# Patient Record
Sex: Female | Born: 1960 | Race: Black or African American | Hispanic: No | Marital: Married | Smoking: Never smoker
Health system: Southern US, Community
[De-identification: ages and names within clinical notes are randomized; demographics above are authoritative.]

## PROBLEM LIST (undated history)

## (undated) DIAGNOSIS — T7840XA Allergy, unspecified, initial encounter: Secondary | ICD-10-CM

## (undated) HISTORY — DX: Allergy, unspecified, initial encounter: T78.40XA

---

## 2009-10-31 ENCOUNTER — Encounter: Admission: RE | Admit: 2009-10-31 | Discharge: 2009-10-31 | Payer: Self-pay | Admitting: Infectious Diseases

## 2009-11-12 ENCOUNTER — Emergency Department (HOSPITAL_COMMUNITY): Admission: EM | Admit: 2009-11-12 | Discharge: 2009-11-13 | Payer: Self-pay | Admitting: Emergency Medicine

## 2009-12-02 ENCOUNTER — Observation Stay (HOSPITAL_COMMUNITY): Admission: EM | Admit: 2009-12-02 | Discharge: 2009-12-02 | Payer: Self-pay | Admitting: Emergency Medicine

## 2009-12-18 ENCOUNTER — Emergency Department (HOSPITAL_COMMUNITY): Admission: EM | Admit: 2009-12-18 | Discharge: 2009-12-18 | Payer: Self-pay | Admitting: Emergency Medicine

## 2009-12-31 ENCOUNTER — Emergency Department (HOSPITAL_COMMUNITY): Admission: EM | Admit: 2009-12-31 | Discharge: 2009-12-31 | Payer: Self-pay | Admitting: Emergency Medicine

## 2010-06-07 LAB — DIFFERENTIAL
Basophils Absolute: 0.1 10*3/uL (ref 0.0–0.1)
Lymphocytes Relative: 31 % (ref 12–46)

## 2010-06-07 LAB — BASIC METABOLIC PANEL
GFR calc Af Amer: >60 mL/min (ref >60)
Glucose, Bld: 111 mg/dL — ABNORMAL HIGH (ref 70–99)
Sodium: 138 mEq/L (ref 135–145)

## 2010-06-07 LAB — CBC
HCT: 41.9 % (ref 36.0–46.0)
MCV: 89.9 fL (ref 78.0–100.0)
Platelets: 339 10*3/uL (ref 150–400)
RDW: 12.2 % (ref 11.5–15.5)

## 2012-01-22 ENCOUNTER — Ambulatory Visit: Payer: Self-pay | Admitting: Family Medicine

## 2012-01-22 VITALS — BP 118/76 | HR 66 | Temp 98.2°F | Resp 16 | Ht 62.75 in | Wt 131.4 lb

## 2012-01-22 DIAGNOSIS — Z23 Encounter for immunization: Secondary | ICD-10-CM

## 2012-01-22 DIAGNOSIS — Z111 Encounter for screening for respiratory tuberculosis: Secondary | ICD-10-CM

## 2012-01-22 NOTE — Progress Notes (Signed)
[facility] [address] [place] [address] [phone]  Date:  01/22/2012   Name:  Teresa Morrison   DOB:  05-10-1960   MRN:  [id number]  PCP:  Sheila Oats, MD    Chief Complaint: Immunizations   History of Present Illness:  Teresa Morrison is a 51 y.o. very pleasant female patient who presents with the following:  Needs a TB screening for attendance at [facility].  She did have BCG when she was a child.  She has had a PPD in the past and been negative.  She has no symptoms of active TB   Tuberculosis Risk Questionnaire  1. Were you born outside the [place] in one of the following parts of the world:    [place], [place], [place], [place] or [place]?  Yes [place]  2. Have you traveled outside the [place] and lived for more than one month in one of the following parts of the world:  [place], [place], [place], [place] or [place]?  She last visited [place] about 2 years ago  3. Do you have a compromised immune system such as from any of the following conditions:  HIV/AIDS, organ or bone marrow transplantation, diabetes, immunosuppressive   medicines (e.g. Prednisone, Remicaide), leukemia, lymphoma, cancer of the   head or neck, gastrectomy or jejunal bypass, end-stage renal disease (on   dialysis), or silicosis?  No    4. Have you ever done one of the following:    Used crack cocaine, injected illegal drugs, worked or resided in jail or prison,   worked or resided at a homeless shelter, or worked as a Research scientist (physical sciences) in   direct contact with patients?  No  5. Have you ever been exposed to anyone with infectious tuberculosis?  No   Tuberculosis Symptom Questionnaire  Do you currently have any of the following symptoms?  1. Unexplained cough lasting more than 3 weeks? No  Unexplained fever lasting more than 3 weeks. No   3. Night Sweats (sweating that leaves the bedclothes and sheets wet)   No  4. Shortness  of Breath No  5. Chest Pain No  6. Unintentional weight loss  No  7. Unexplained fatigue (very tired for no reason) No    There is no problem list on file for this patient.   No past medical history on file.  No past surgical history on file.  History  Substance Use Topics  . Smoking status: Never Smoker   . Smokeless tobacco: Not on file  . Alcohol Use: Not on file    No family history on file.  No Known Allergies  Medication list has been reviewed and updated.  No current outpatient prescriptions on file prior to visit.    Review of Systems:  As per HPI- otherwise negative.   Physical Examination: Filed Vitals:   01/22/12 1018  BP: 118/76  Pulse: 66  Temp: 98.2 F (36.8 C)  Resp: 16   Filed Vitals:   01/22/12 1018  Height: 5' 2.75" (1.594 m)  Weight: 131 lb 6.4 oz (59.603 kg)   Body mass index is 23.46 kg/(m^2). Ideal Body Weight: Weight in (lb) to have BMI = 25: 139.7   GEN: WDWN, NAD, Non-toxic, A & O x 3 HEENT: Atraumatic, Normocephalic. Neck supple. No masses, No LAD. Ears and Nose: No external deformity. CV: RRR, No M/G/R. No JVD. No thrill. No extra heart sounds. PULM: CTA B, no wheezes, crackles, rhonchi. No retractions. No  resp. distress. No accessory muscle use. EXTR: No c/c/e NEURO Normal gait.  PSYCH: Normally interactive. Conversant. Not depressed or anxious appearing.  Calm demeanor.    Assessment and Plan: 1. Need for prophylactic vaccination and inoculation against influenza  Flu vaccine greater than or equal to 3yo preservative free IM  2. Screening-pulmonary TB     Gave letter stating that she does not have active pulmonary TB.  If a PPD is required she should be able to get one at a later date  [facility], MD

## 2012-12-26 ENCOUNTER — Ambulatory Visit: Payer: Self-pay | Admitting: Physician Assistant

## 2012-12-26 VITALS — BP 115/70 | HR 80 | Temp 98.4°F | Resp 18 | Wt 128.0 lb

## 2012-12-26 DIAGNOSIS — Z111 Encounter for screening for respiratory tuberculosis: Secondary | ICD-10-CM

## 2012-12-26 NOTE — Progress Notes (Signed)
  Tuberculosis Risk Questionnaire  1. Yes Were you born outside the [place] in one of the following parts of the world: [place], [place], [place], [place] or Eastern [place]?    2. No Have you traveled outside the [place] and lived for more than one month in one of the following parts of the world: [place], [place], [place], [place] or Eastern [place]?    3. No Do you have a compromised immune system such as from any of the following conditions:HIV/AIDS, organ or bone marrow transplantation, diabetes, immunosuppressive medicines (e.g. Prednisone, Remicaide), leukemia, lymphoma, cancer of the head or neck, gastrectomy or jejunal bypass, end-stage renal disease (on dialysis), or silicosis?     4. 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    5. No Have you ever: injected illegal drugs, used crack cocaine, lived in a homeless shelter  or been in jail or prison?     6. No Have you ever been exposed to anyone with infectious tuberculosis?    Tuberculosis Symptom Questionnaire  Do you currently have any of the following symptoms?  1. No Unexplained cough lasting more than 3 weeks?   2. No Unexplained fever lasting more than 3 weeks.   3. No Night Sweats (sweating that leaves the bedclothes and sheets wet)     4. No Shortness of Breath   5. No Chest Pain   6. No Unintentional weight loss    7. No Unexplained fatigue (very tired for no reason)   

## 2012-12-26 NOTE — Progress Notes (Signed)
  Subjective:    Patient ID: Teresa Morrison, female    DOB: 1960-06-15, 52 y.o.   MRN: [id number]  HPI 52 year old female presents for TB screening. 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  She was born in [place] and had the BCG shot at that time. Has had negative TB tests since then as well as a CXR.    See TB screening questionnaire.     Review of Systems  Constitutional: Negative for fever and chills.  Respiratory: Negative for cough.   Cardiovascular: Negative for chest pain.  Neurological: Negative for dizziness.       Objective:   Physical Exam  Constitutional: She is oriented to person, place, and time. She appears well-developed and well-nourished.  HENT:  Head: Normocephalic and atraumatic.  Right Ear: External ear normal.  Left Ear: External ear normal.  Eyes: Conjunctivae are normal.  Neck: Normal range of motion.  Cardiovascular: Normal rate.   Pulmonary/Chest: Effort normal.  Neurological: She is alert and oriented to person, place, and time.  Psychiatric: She has a normal mood and affect. Her behavior is normal. Judgment and thought content normal.          Assessment & Plan:  Screening for tuberculosis - Plan: TB Skin Test  TB test placed. Return in 48-72 hours for read.

## 2012-12-28 ENCOUNTER — Ambulatory Visit (INDEPENDENT_AMBULATORY_CARE_PROVIDER_SITE_OTHER): Payer: Self-pay | Admitting: Family Medicine

## 2012-12-28 DIAGNOSIS — Z111 Encounter for screening for respiratory tuberculosis: Secondary | ICD-10-CM

## 2012-12-28 LAB — TB SKIN TEST: TB Skin Test: POSITIVE

## 2014-02-21 ENCOUNTER — Ambulatory Visit (INDEPENDENT_AMBULATORY_CARE_PROVIDER_SITE_OTHER): Payer: PRIVATE HEALTH INSURANCE | Admitting: Physician Assistant

## 2014-02-21 VITALS — BP 116/70 | HR 77 | Temp 97.9°F | Resp 18 | Ht 63.0 in | Wt 122.0 lb

## 2014-02-21 DIAGNOSIS — Z131 Encounter for screening for diabetes mellitus: Secondary | ICD-10-CM

## 2014-02-21 DIAGNOSIS — Z13220 Encounter for screening for lipoid disorders: Secondary | ICD-10-CM

## 2014-02-21 LAB — LIPID PANEL
Cholesterol: 178 mg/dL (ref 0–200)
HDL: 67 mg/dL (ref >39)
LDL CALC: 100 mg/dL — AB (ref 0–99)
Total CHOL/HDL Ratio: 2.7 Ratio
Triglycerides: 57 mg/dL (ref <150)
VLDL: 11 mg/dL (ref 0–40)

## 2014-02-21 LAB — GLUCOSE, POCT (MANUAL RESULT ENTRY): POC GLUCOSE: 89 mg/dL (ref 70–99)

## 2014-02-21 NOTE — Progress Notes (Signed)
    IDENTIFYING INFORMATION  Teresa Morrison / DOB: Aug 18, 1960 / MRN: [id number]  The patient has no medical problems.  SUBJECTIVE  Chief Complaint: Labs Only   History of present illness: [name] is a 53 y.o. year old female who presents for a cholesterol and blood sugar check.  She has no complaints today. [place] has been in [place] and moved from [place].  She declines an annual physical today due to cost.      She  has a past medical history of Allergy.  However, since [place] she has not had difficulty with this.    She takes no medication.   [name] has No Known Allergies.  She  reports that she currently engages in sexual activity.  The patient has had no surgery.  Her family history is negative for chronic disease.   Review of Systems  Constitutional: Negative for fever, chills and weight loss.  Eyes: Negative for blurred vision.  Respiratory: Negative for cough and wheezing.   Cardiovascular: Negative for chest pain and palpitations.  Gastrointestinal: Negative for heartburn, nausea and vomiting.  Genitourinary: Negative for dysuria, urgency and frequency.  Musculoskeletal: Negative for myalgias.  Skin: Negative for itching and rash.  Neurological: Negative for dizziness and headaches.  Psychiatric/Behavioral: Negative for depression.    OBJECTIVE  Blood pressure 116/70, pulse 77, temperature 97.9 F (36.6 C), temperature source Oral, resp. rate 18, height 5\' 3"  (1.6 m), weight 122 lb (55.339 kg), SpO2 98 %. The patient's body mass index is 21.62 kg/(m^2).  Physical Exam  Constitutional: She is oriented to person, place, and time. She appears well-developed and well-nourished. No distress.  HENT:  Head: Normocephalic.  Eyes: EOM are normal.  Neck: Normal range of motion.  Cardiovascular: Normal rate and regular rhythm.   Respiratory: Effort normal and breath sounds normal.  GI: Soft. Bowel sounds are normal.  Musculoskeletal:  Normal range of motion.  Neurological: She is alert and oriented to person, place, and time. She has normal reflexes.  Skin: Skin is warm and dry. She is not diaphoretic.  Psychiatric: She has a normal mood and affect. Her behavior is normal. Judgment and thought content normal.    Results for orders placed or performed in visit on 02/21/14  Lipid panel  Result Value Ref Range   Cholesterol 178 0 - 200 mg/dL   Triglycerides 57 <308<150 mg/dL   HDL 67 >65>39 mg/dL   Total CHOL/HDL Ratio 2.7 Ratio   VLDL 11 0 - 40 mg/dL   LDL Cholesterol 784100 (H) 0 - 99 mg/dL  POCT glucose (manual entry)  Result Value Ref Range   POC Glucose 89 70 - 99 mg/dl     ASSESSMENT & PLAN  [name] was seen today for labs only.  Diagnoses and associated orders for this visit:  Screening for diabetes mellitus (DM) - POCT glucose (manual entry)  Screening for lipid disorders - Lipid panel     The patient was instructed to to call or comeback to clinic as needed, or should symptoms warrant.  [name], MHS, PA-C [facility] 02/22/2014 11:06 AM

## 2014-02-21 NOTE — Patient Instructions (Addendum)
Exercise improves every system in the body.  It lowers the risk of heart disease, decreases blood pressure, reduces the symptoms of depression and anxiety, and lowers blood sugar. To receive these benefits, try to get 150 minutes of planned exercise each week.  You can break this 150 minutes up however you like.  For instance, you can perform 30 minutes of brisk walking 5 days a week, or perform 50 minutes 3 days a week.  If you don't like walking, or can't find a safe place to walk, find another way to move that you can enjoy.  Exercise tapes, cycling, stair climbing, swimming, or a combination will be just as good as a walking program. To ensure the proper intensity, you can use the talk test. Essentially, you should be able to carry on a conversation, but you should have to take short breaks from the conversation in order catch your breath.  Try to eat fish twice a week, or take a fish oil product over the counter daily. Speak to the pharmacist about which would be appropriate for you.    Keeping You Healthy  Get These Tests  Blood Pressure- Have your blood pressure checked by your healthcare provider at least once a year.  Normal blood pressure is 120/80.  Weight- Have your body mass index (BMI) calculated to screen for obesity.  BMI is a measure of body fat based on height and weight.  You can calculate your own BMI at https://www.west-esparza.com/www.nhlbisupport.com/bmi/  Cholesterol- Have your cholesterol checked every year.  Diabetes- Have your blood sugar checked every year if you have high blood pressure, high cholesterol, a family history of diabetes or if you are overweight.  Pap Smear- Have a pap smear every 1 to 3 years if you have been sexually active.  If you are older than 65 and recent pap smears have been normal you may not need additional pap smears.  In addition, if you have had a hysterectomy  For benign disease additional pap smears are not necessary.  Mammogram-Yearly mammograms are essential for early  detection of breast cancer  Screening for Colon Cancer- Colonoscopy starting at age 54. Screening may begin sooner depending on your family history and other health conditions.  Follow up colonoscopy as directed by your Gastroenterologist.  Screening for Osteoporosis- Screening begins at age 54 with bone density scanning, sooner if you are at higher risk for developing Osteoporosis.  Get these medicines  Calcium with Vitamin D- Your body requires 1200-1500 mg of Calcium a day and [id number] IU of Vitamin D a day.  You can only absorb 500 mg of Calcium at a time therefore Calcium must be taken in 2 or 3 separate doses throughout the day.  Hormones- Hormone therapy has been associated with increased risk for certain cancers and heart disease.  Talk to your healthcare provider about if you need relief from menopausal symptoms.  Aspirin- Ask your healthcare provider about taking Aspirin to prevent Heart Disease and Stroke.  Get these Immuniztions  Flu shot- Every fall  Pneumonia shot- Once after the age of 54; if you are younger ask your healthcare provider if you need a pneumonia shot.  Tetanus- Every ten years.  Zostavax- Once after the age of 54 to prevent shingles.  Take these steps  Don't smoke- Your healthcare provider can help you quit. For tips on how to quit, ask your healthcare provider or go to www.smokefree.gov or call 1-800 QUIT-NOW.  Be physically active- Exercise 5 days a week for  a minimum of 30 minutes.  If you are not already physically active, start slow and gradually work up to 30 minutes of moderate physical activity.  Try walking, dancing, bike riding, swimming, etc.  Eat a healthy diet- Eat a variety of healthy foods such as fruits, vegetables, whole grains, low fat milk, low fat cheeses, yogurt, lean meats, chicken, fish, eggs, dried beans, tofu, etc.  For more information go to www.thenutritionsource.org  Dental visit- Brush and floss teeth twice daily; visit your  dentist twice a year.  Eye exam- Visit your Optometrist or Ophthalmologist yearly.  Drink alcohol in moderation- Limit alcohol intake to one drink or less a day.  Never drink and drive.  Depression- Your emotional health is as important as your physical health.  If you're feeling down or losing interest in things you normally enjoy, please talk to your healthcare provider.  Seat Belts- can save your life; always wear one  Smoke/Carbon Monoxide detectors- These detectors need to be installed on the appropriate level of your home.  Replace batteries at least once a year.  Violence- If anyone is threatening or hurting you, please tell your healthcare provider.  Living Will/ Health care power of attorney- Discuss with your healthcare provider and family.

## 2014-02-28 NOTE — Progress Notes (Signed)
I have discussed this case with Mr. Clark, PA-C and agree.  

## 2015-05-15 ENCOUNTER — Encounter: Payer: Self-pay | Admitting: Physician Assistant

## 2015-05-15 ENCOUNTER — Ambulatory Visit (INDEPENDENT_AMBULATORY_CARE_PROVIDER_SITE_OTHER): Payer: Managed Care, Other (non HMO) | Admitting: Physician Assistant

## 2015-05-15 VITALS — BP 119/75 | HR 51 | Temp 98.4°F | Resp 16 | Ht 62.5 in | Wt 131.0 lb

## 2015-05-15 DIAGNOSIS — R51 Headache: Secondary | ICD-10-CM | POA: Diagnosis not present

## 2015-05-15 DIAGNOSIS — R69 Illness, unspecified: Principal | ICD-10-CM

## 2015-05-15 DIAGNOSIS — M791 Myalgia: Secondary | ICD-10-CM

## 2015-05-15 DIAGNOSIS — R509 Fever, unspecified: Secondary | ICD-10-CM | POA: Diagnosis not present

## 2015-05-15 DIAGNOSIS — R05 Cough: Secondary | ICD-10-CM | POA: Diagnosis not present

## 2015-05-15 DIAGNOSIS — J111 Influenza due to unidentified influenza virus with other respiratory manifestations: Secondary | ICD-10-CM

## 2015-05-15 DIAGNOSIS — J101 Influenza due to other identified influenza virus with other respiratory manifestations: Secondary | ICD-10-CM

## 2015-05-15 LAB — POCT INFLUENZA A/B
INFLUENZA A, POC: POSITIVE — AB
INFLUENZA B, POC: NEGATIVE

## 2015-05-15 MED ORDER — OSELTAMIVIR PHOSPHATE 75 MG PO CAPS: 75.0000 mg | ORAL_CAPSULE | Freq: Two times a day (BID) | ORAL | Status: DC

## 2015-05-15 NOTE — Patient Instructions (Signed)
Take 600 mg Ibuprofen every 6-8 hours for fever, pain and fatigue.

## 2015-05-15 NOTE — Progress Notes (Signed)
   05/15/2015 4:12 PM   DOB: April 28, 1960 / MRN: [id number]  SUBJECTIVE:  Teresa Morrison is a 55 y.o. female presenting for fever that started on Friday night.  Assoicates myagia, fatigue, poor appetitie, HA. She denies cough, dysuria.  She has tried Advil with some relief.      She has No Known Allergies.   She  has a past medical history of Allergy.    She  reports that she has never smoked. She does not have any smokeless tobacco history on file. She reports that she does not drink alcohol or use illicit drugs. She  reports that she currently engages in sexual activity. The patient  has no past surgical history on file.  Her family history is not on file.  Review of Systems  Constitutional: Positive for fever and chills.  HENT: Negative for sore throat.   Eyes: Negative for blurred vision.  Respiratory: Positive for cough and sputum production. Negative for shortness of breath and wheezing.   Cardiovascular: Negative for chest pain.  Gastrointestinal: Negative for nausea and abdominal pain.  Genitourinary: Negative for dysuria, urgency and frequency.  Musculoskeletal: Positive for myalgias.  Skin: Negative for rash.  Neurological: Positive for headaches. Negative for dizziness and tingling.  Psychiatric/Behavioral: Negative for depression. The patient is not nervous/anxious.     Problem list and medications reviewed and updated by myself where necessary, and exist elsewhere in the encounter.   OBJECTIVE:  BP 119/75 mmHg  Pulse 51  Temp(Src) 98.4 F (36.9 C)  Resp 16  Ht 5' 2.5" (1.588 m)  Wt 131 lb (59.421 kg)  BMI 23.56 kg/m2  Physical Exam  Constitutional: She is oriented to person, place, and time. She appears well-developed.  Eyes: EOM are normal. Pupils are equal, round, and reactive to light.  Cardiovascular: Normal rate.   Pulmonary/Chest: Effort normal.  Abdominal: She exhibits no distension.  Musculoskeletal: Normal range of motion.  Neurological: She is  alert and oriented to person, place, and time. No cranial nerve deficit.  Skin: Skin is warm and dry. She is not diaphoretic.  Psychiatric: She has a normal mood and affect.  Vitals reviewed.   Results for orders placed or performed in visit on 05/15/15 (from the past 72 hour(s))  POCT Influenza A/B     Status: Abnormal   Collection Time: 05/15/15  3:56 PM  Result Value Ref Range   Influenza A, POC Positive (A) Negative   Influenza B, POC Negative Negative    No results found.  ASSESSMENT AND PLAN  Teresa Morrison was seen today for fever, joint pain and headache.  Diagnoses and all orders for this visit:  Influenza-like illness -     POCT Influenza A/B  Influenza A -     oseltamivir (TAMIFLU) 75 MG capsule; Take 1 capsule (75 mg total) by mouth 2 (two) times daily.    The patient was advised to call or return to clinic if she does not see an improvement in symptoms or to seek the care of the closest emergency department if she worsens with the above plan.   [name], MHS, PA-C [facility] 05/15/2015 4:12 PM

## 2016-01-06 ENCOUNTER — Ambulatory Visit (INDEPENDENT_AMBULATORY_CARE_PROVIDER_SITE_OTHER): Payer: Managed Care, Other (non HMO) | Admitting: Family Medicine

## 2016-01-06 ENCOUNTER — Encounter: Payer: Self-pay | Admitting: Family Medicine

## 2016-01-06 VITALS — BP 102/66 | HR 72 | Temp 98.3°F | Resp 18 | Ht 62.5 in | Wt 134.0 lb

## 2016-01-06 DIAGNOSIS — R1011 Right upper quadrant pain: Secondary | ICD-10-CM | POA: Diagnosis not present

## 2016-01-06 DIAGNOSIS — Z131 Encounter for screening for diabetes mellitus: Secondary | ICD-10-CM | POA: Diagnosis not present

## 2016-01-06 DIAGNOSIS — Z13220 Encounter for screening for lipoid disorders: Secondary | ICD-10-CM | POA: Diagnosis not present

## 2016-01-06 LAB — LIPID PANEL
Cholesterol: 175 mg/dL (ref 125–200)
HDL: 65 mg/dL (ref >=46)
LDL CALC: 93 mg/dL (ref <130)
TRIGLYCERIDES: 84 mg/dL (ref <150)
Total CHOL/HDL Ratio: 2.7 Ratio (ref <=5.0)
VLDL: 17 mg/dL (ref <30)

## 2016-01-06 LAB — CBC WITH DIFFERENTIAL/PLATELET
BASOS PCT: 0 %
Basophils Absolute: 0 cells/uL (ref 0–200)
EOS PCT: 4 %
Eosinophils Absolute: 272 cells/uL (ref 15–500)
HEMATOCRIT: 36.1 % (ref 35.0–45.0)
HEMOGLOBIN: 12.1 g/dL (ref 11.7–15.5)
LYMPHS ABS: 2788 {cells}/uL (ref 850–3900)
Lymphocytes Relative: 41 %
MCH: 30.3 pg (ref 27.0–33.0)
MCHC: 33.5 g/dL (ref 32.0–36.0)
MCV: 90.5 fL (ref 80.0–100.0)
MONO ABS: 408 {cells}/uL (ref 200–950)
MPV: 11.1 fL (ref 7.5–12.5)
Monocytes Relative: 6 %
NEUTROS ABS: 3332 {cells}/uL (ref 1500–7800)
Neutrophils Relative %: 49 %
Platelets: 272 10*3/uL (ref 140–400)
RBC: 3.99 MIL/uL (ref 3.80–5.10)
RDW: 12.6 % (ref 11.0–15.0)
WBC: 6.8 10*3/uL (ref 3.8–10.8)

## 2016-01-06 LAB — COMPREHENSIVE METABOLIC PANEL
ALBUMIN: 3.9 g/dL (ref 3.6–5.1)
ALK PHOS: 59 U/L (ref 33–130)
ALT: 16 U/L (ref 6–29)
AST: 24 U/L (ref 10–35)
BILIRUBIN TOTAL: 0.2 mg/dL (ref 0.2–1.2)
BUN: 15 mg/dL (ref 7–25)
CALCIUM: 9.2 mg/dL (ref 8.6–10.4)
CO2: 27 mmol/L (ref 20–31)
CREATININE: 0.82 mg/dL (ref 0.50–1.05)
Chloride: 106 mmol/L (ref 98–110)
Glucose, Bld: 82 mg/dL (ref 65–99)
Potassium: 4.7 mmol/L (ref 3.5–5.3)
SODIUM: 140 mmol/L (ref 135–146)
Total Protein: 7.2 g/dL (ref 6.1–8.1)

## 2016-01-06 LAB — POCT URINALYSIS DIP (MANUAL ENTRY)
Bilirubin, UA: NEGATIVE
Blood, UA: NEGATIVE
GLUCOSE UA: NEGATIVE
Ketones, POC UA: NEGATIVE
NITRITE UA: NEGATIVE
PROTEIN UA: NEGATIVE
SPEC GRAV UA: 1.02
UROBILINOGEN UA: 0.2
pH, UA: 5

## 2016-01-06 LAB — HEMOGLOBIN A1C
HEMOGLOBIN A1C: 5.3 % (ref <5.7)
Mean Plasma Glucose: 105 mg/dL

## 2016-01-06 NOTE — Patient Instructions (Addendum)
IF you received an x-ray today, you will receive an invoice from [facility]. Please contact [facility] at [phone] with questions or concerns regarding your invoice.   IF you received labwork today, you will receive an invoice from [facility]. Please contact [facility] at [phone] with questions or concerns regarding your invoice.   Our billing staff will not be able to assist you with questions regarding bills from these companies.  You will be contacted with the lab results as soon as they are available. The fastest way to get your results is to activate your My Chart account. Instructions are located on the last page of this paperwork. If you have not heard from us regarding the results in 2 weeks, please contact this office.  Abdominal Pain, Adult Many things can cause abdominal pain. Usually, abdominal pain is not caused by a disease and will improve without treatment. It can often be observed and treated at home. Your health care provider will do a physical exam and possibly order blood tests and X-rays to help determine the seriousness of your pain. However, in many cases, more time must pass before a clear cause of the pain can be found. Before that point, your health care provider may not know if you need more testing or further treatment. HOME CARE INSTRUCTIONS Monitor your abdominal pain for any changes. The following actions may help to alleviate any discomfort you are experiencing:  Only take over-the-counter or prescription medicines as directed by your health care provider.  Do not take laxatives unless directed to do so by your health care provider.  Try a clear liquid diet (broth, tea, or water) as directed by your health care provider. Slowly move to a bland diet as tolerated. SEEK MEDICAL CARE IF:  You have unexplained abdominal pain.  You have abdominal pain associated with nausea or diarrhea.  You have pain when you  urinate or have a bowel movement.  You experience abdominal pain that wakes you in the night.  You have abdominal pain that is worsened or improved by eating food.  You have abdominal pain that is worsened with eating fatty foods.  You have a fever. SEEK IMMEDIATE MEDICAL CARE IF:  Your pain does not go away within 2 hours.  You keep throwing up (vomiting).  Your pain is felt only in portions of the abdomen, such as the right side or the left lower portion of the abdomen.  You pass bloody or black tarry stools. MAKE SURE YOU:  Understand these instructions.  Will watch your condition.  Will get help right away if you are not doing well or get worse.   This information is not intended to replace advice given to you by your health care provider. Make sure you discuss any questions you have with your health care provider.   Document Released: 12/19/2004 Document Revised: 11/30/2014 Document Reviewed: 11/18/2012 Elsevier Interactive Patient Education 2016 [employer]  

## 2016-01-06 NOTE — Progress Notes (Signed)
Subjective:    Patient ID: Teresa Morrison, female    DOB: Nov 27, 1960, 55 y.o.   MRN: [id number]  01/06/2016  Flank Pain (right) and blood work A1C   HPI This 55 y.o. female presents for flank pain and also requesting labs/HgbA1c/lipids.  Onset of RUQ pain six months ago intermittently; pain usually develops after eating a high fatty meal.  Worried about gallbladder pathology. Denies fever/chills/sweats. Denies unintentional weight loss.  No previous colonoscopy; no previous health maintenance such as pap smear or mammography. Denies chest pain or SOB.  No nausea/vomiting/diarrhea/constipation. No bloody stools or melena.  Denies dysuria, hematuria, urgency, or frequency.  LMP ten years ago.  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  Last lipid panel ten years ago in [place].    Review of Systems  Constitutional: Negative for chills, diaphoresis, fatigue and fever.  Eyes: Negative for visual disturbance.  Respiratory: Negative for cough and shortness of breath.   Cardiovascular: Negative for chest pain, palpitations and leg swelling.  Gastrointestinal: Positive for abdominal pain. Negative for abdominal distention, anal bleeding, blood in stool, constipation, diarrhea, nausea, rectal pain and vomiting.  Endocrine: Negative for cold intolerance, heat intolerance, polydipsia, polyphagia and polyuria.  Neurological: Negative for dizziness, tremors, seizures, syncope, facial asymmetry, speech difficulty, weakness, light-headedness, numbness and headaches.    Past Medical History:  Diagnosis Date  . Allergy    History reviewed. No pertinent surgical history. No Known Allergies No current outpatient prescriptions on file.   No current facility-administered medications for this visit.    Social History   Social History  . Marital status: Married    Spouse name: N/A  . Number of children: 5  . Years of education: N/A   Occupational History  . CNA    Social  History Main Topics  . Smoking status: Never Smoker  . Smokeless tobacco: Never Used  . Alcohol use No  . Drug use: No  . Sexual activity: Yes   Other Topics Concern  . Not on file   Social History Narrative  . No narrative on file   History reviewed. No pertinent family history.     Objective:    BP 102/66 (BP Location: Right Arm, Patient Position: Sitting, Cuff Size: Small)   Pulse 72   Temp 98.3 F (36.8 C) (Oral)   Resp 18   Ht 5' 2.5" (1.588 m)   Wt 134 lb (60.8 kg)   SpO2 100%   BMI 24.12 kg/m  Physical Exam  Constitutional: She is oriented to person, place, and time. She appears well-developed and well-nourished. No distress.  HENT:  Head: Normocephalic and atraumatic.  Right Ear: External ear normal.  Left Ear: External ear normal.  Nose: Nose normal.  Mouth/Throat: Oropharynx is clear and moist.  Eyes: Conjunctivae and EOM are normal. Pupils are equal, round, and reactive to light.  Neck: Normal range of motion. Neck supple. Carotid bruit is not present. No thyromegaly present.  Cardiovascular: Normal rate, regular rhythm, normal heart sounds and intact distal pulses.  Exam reveals no gallop and no friction rub.   No murmur heard. Pulmonary/Chest: Effort normal and breath sounds normal. She has no wheezes. She has no rales.  Abdominal: Soft. Bowel sounds are normal. She exhibits no distension and no mass. There is no tenderness. There is no rebound and no guarding.  Musculoskeletal:       Right shoulder: Normal.       Left shoulder: Normal.  Thoracic back: Normal.       Lumbar back: Normal.  +mild TTP R anterior distal rib.  Lymphadenopathy:    She has no cervical adenopathy.  Neurological: She is alert and oriented to person, place, and time. No cranial nerve deficit.  Skin: Skin is warm and dry. No rash noted. She is not diaphoretic. No erythema. No pallor.  Psychiatric: She has a normal mood and affect. Her behavior is normal.    Results for  orders placed or performed in visit on 01/06/16  POCT urinalysis dipstick  Result Value Ref Range   Color, UA yellow yellow   Clarity, UA clear clear   Glucose, UA negative negative   Bilirubin, UA negative negative   Ketones, POC UA negative negative   Spec Grav, UA 1.020    Blood, UA negative negative   pH, UA 5.0    Protein Ur, POC negative negative   Urobilinogen, UA 0.2    Nitrite, UA Negative Negative   Leukocytes, UA small (1+) (A) Negative       Assessment & Plan:   1. Abdominal pain, RUQ   2. Screening, lipid   3. Screening for diabetes mellitus    -New onset in past six months postprandially. -obtain labs and refer for abdominal us. -obtain age appropriate screening labs including HgbA1c and FLP. -RTC in six months for CPE.   Orders Placed This Encounter  Procedures  . US Abdomen Complete    Standing Status:   Future    Standing Expiration Date:   03/07/2017    Scheduling Instructions:     Prefers a.m. Appointment or Saturday or Monday    Order Specific Question:   Reason for Exam (SYMPTOM  OR DIAGNOSIS REQUIRED)    Answer:   postprandial RUQ pain    Order Specific Question:   Preferred imaging location?    Answer:   [facility]  . CBC with Differential/Platelet  . Comprehensive metabolic panel    Order Specific Question:   Has the patient fasted?    Answer:   Yes  . Lipid panel    Order Specific Question:   Has the patient fasted?    Answer:   Yes  . Hemoglobin A1c  . POCT urinalysis dipstick   No orders of the defined types were placed in this encounter.   No Follow-up on file.   Antwanette Wesche Paulita FujitaMartin Yordan Martindale, M.D. [facility]  [place] [address] [place], [place]  [id number] [phone] phone [phone] fax

## 2016-01-22 ENCOUNTER — Telehealth: Payer: Self-pay | Admitting: *Deleted

## 2016-01-22 NOTE — Telephone Encounter (Signed)
Patient calling again about lab results from 01/06/16.

## 2016-01-22 NOTE — Telephone Encounter (Signed)
Patient left message on lab voicemail.  She would like to know her lab results from 01/06/16.  [phone]

## 2016-01-23 ENCOUNTER — Telehealth: Payer: Self-pay | Admitting: Emergency Medicine

## 2016-01-23 NOTE — Telephone Encounter (Signed)
Pt calling back About lab results

## 2016-01-23 NOTE — Telephone Encounter (Signed)
Left message with normal lab results.

## 2016-01-23 NOTE — Telephone Encounter (Signed)
Labs all normal --- 1. No evidence of diabetes; sugar is normal. 2 . Cholesterol is normal. 3 .  Gallbladder labs are normal.  4. Liver and kidney functions are normal.

## 2016-01-24 NOTE — Telephone Encounter (Signed)
Left message to return call 

## 2016-03-11 ENCOUNTER — Telehealth: Payer: Self-pay

## 2016-03-11 NOTE — Telephone Encounter (Signed)
Normal results left on voicemail along with current date/time.

## 2016-03-11 NOTE — Telephone Encounter (Signed)
PATIENT CALLED VERY UPSET BECAUSE SHE SAID WE NEVER CALLED HER WITH HER LAB RESULTS FROM WHEN SHE SAW DR. Katrinka BlazingSMITH IN October. (I TOLD HER WE TRIED TO CALL HER ON January 23, 2016, BUT SHE WOULD NOT LISTEN). PLEASE CALL HER AS SOON AS POSSIBLE. BEST PHONE [phone] (CELL)  [facility]

## 2016-03-13 ENCOUNTER — Telehealth: Payer: Self-pay | Admitting: Emergency Medicine

## 2016-03-13 NOTE — Telephone Encounter (Signed)
Pt called in requesting lab results from one 2 months ago.  Explained to patient we left several messages with negative results with documentation.  Pt is very upset that she received a bill for delayed blood work results.  Attempted to resolve situation but she is very upset.

## 2016-04-12 ENCOUNTER — Encounter: Payer: Self-pay | Admitting: Family Medicine

## 2017-06-21 ENCOUNTER — Ambulatory Visit: Payer: BLUE CROSS/BLUE SHIELD | Admitting: Family Medicine

## 2017-06-21 ENCOUNTER — Other Ambulatory Visit: Payer: Self-pay

## 2017-06-21 ENCOUNTER — Encounter: Payer: Self-pay | Admitting: Family Medicine

## 2017-06-21 ENCOUNTER — Ambulatory Visit (INDEPENDENT_AMBULATORY_CARE_PROVIDER_SITE_OTHER): Payer: BLUE CROSS/BLUE SHIELD

## 2017-06-21 VITALS — BP 102/68 | HR 73 | Temp 98.0°F | Resp 16 | Ht 62.21 in | Wt 135.8 lb

## 2017-06-21 DIAGNOSIS — Z131 Encounter for screening for diabetes mellitus: Secondary | ICD-10-CM | POA: Diagnosis not present

## 2017-06-21 DIAGNOSIS — M25476 Effusion, unspecified foot: Secondary | ICD-10-CM | POA: Diagnosis not present

## 2017-06-21 DIAGNOSIS — M25473 Effusion, unspecified ankle: Secondary | ICD-10-CM | POA: Diagnosis not present

## 2017-06-21 DIAGNOSIS — M25571 Pain in right ankle and joints of right foot: Secondary | ICD-10-CM

## 2017-06-21 DIAGNOSIS — M7732 Calcaneal spur, left foot: Secondary | ICD-10-CM

## 2017-06-21 DIAGNOSIS — Z13220 Encounter for screening for lipoid disorders: Secondary | ICD-10-CM

## 2017-06-21 DIAGNOSIS — M25572 Pain in left ankle and joints of left foot: Secondary | ICD-10-CM | POA: Diagnosis not present

## 2017-06-21 DIAGNOSIS — M25475 Effusion, left foot: Secondary | ICD-10-CM

## 2017-06-21 DIAGNOSIS — M25471 Effusion, right ankle: Secondary | ICD-10-CM

## 2017-06-21 NOTE — Patient Instructions (Addendum)
CLINICAL DATA:  Bilateral ankle pain.  EXAM: LEFT ANKLE COMPLETE - 3+ VIEW  COMPARISON:  None  FINDINGS: There is no evidence of fracture, dislocation, or joint effusion. There is no evidence of arthropathy or other focal bone abnormality. Small plantar heel spur noted. Soft tissues are unremarkable.  IMPRESSION: 1. No acute findings. 2. Heel spur.   Electronically Signed   By: Signa Kell M.D.   On: 06/21/2017 10:15    IF you received an x-ray today, you will receive an invoice from [facility]. Please contact [facility] at [phone] with questions or concerns regarding your invoice.   IF you received labwork today, you will receive an invoice from [facility]. Please contact [facility] at [phone] with questions or concerns regarding your invoice.   Our billing staff will not be able to assist you with questions regarding bills from these companies.  You will be contacted with the lab results as soon as they are available. The fastest way to get your results is to activate your My Chart account. Instructions are located on the last page of this paperwork. If you have not heard from [place] regarding the results in 2 weeks, please contact this office.      Heel Spur A heel spur is a bony growth that forms on the bottom of your heel bone (calcaneus). Heel spurs are common and do not always cause pain. However, heel spurs often cause inflammation in the strong band of tissue that runs underneath the bone of your foot (plantar fascia). When this happens, you may feel pain on the bottom of your foot, near your heel. What are the causes? The cause of heel spurs is not completely understood. They may be caused by pressure on the heel. Or, they may stem from the muscle attachments (tendons) near the spur pulling on the heel. What increases the risk? You may be at risk for a heel spur if you:  Are older than 40.  Are overweight.  Have wear and tear  arthritis (osteoarthritis).  Have plantar fascia inflammation.  What are the signs or symptoms? Some people have heel spurs but no symptoms. If you do have symptoms, they may include:  Pain in the bottom of your heel.  Pain that is worse when you first get out of bed.  Pain that gets worse after walking or standing.  How is this diagnosed? Your health care provider may diagnose a heel spur based on your symptoms and a physical exam. You may also have an X-ray of your foot to check for a bony growth coming from the calcaneus. How is this treated? Treatment aims to relieve the pain from the heel spur. This may include:  Stretching exercises.  Losing weight.  Wearing specific shoes, inserts, or orthotics for comfort and support.  Wearing splints at night to properly position your feet.  Taking over-the-counter medicine to relieve pain.  Being treated with high-intensity sound waves to break up the heel spur (extracorporeal shock wave therapy).  Getting steroid injections in your heel to reduce swelling and ease pain.  Having surgery if your heel spur causes long-term (chronic) pain.  Follow these instructions at home:  Take medicines only as directed by your health care provider.  Ask your health care provider if you should use ice or cold packs on the painful areas of your heel or foot.  Avoid activities that cause you pain until you recover or as directed by your health care provider.  Stretch before exercising or  being physically active.  Wear supportive shoes that fit well as directed by your health care provider. You might need to buy new shoes. Wearing old shoes or shoes that do not fit correctly may not provide the support that you need.  Lose weight if your health care provider thinks you should. This can relieve pressure on your foot that may be causing pain and discomfort. Contact a health care provider if:  Your pain continues or gets worse. This information  is not intended to replace advice given to you by your health care provider. Make sure you discuss any questions you have with your health care provider. Document Released: 04/17/2005 Document Revised: 08/17/2015 Document Reviewed: 05/12/2013 Elsevier Interactive Patient Education  [employer]2018 [employer]vier Inc[employer]

## 2017-06-21 NOTE — Progress Notes (Signed)
Chief Complaint  Patient presents with  . Ankle Pain    pt states she has been having swelling in both ankles x 1 month with some pain.     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 and getting swollen for the past month She reports that she stands at work  She feels more pain in the left foot and ankle than on the right She states that she had no trauma  She can walk without a limp First thing in the morning her ankles are not as swollen    Dyslipidemia She reports a history of high cholesterol 10 years ago and did not take any meds She used diet and exercise to manage She exercises daily when the weather is nice She denies chest pains, palpitations, shortness of breath, claudication The 10-year ASCVD risk score Denman George(Goff DC Montez HagemanJr., et al., 2013) is: 1.5%   Values used to calculate the score:     Age: [age] years     Sex: Female     Is Non-Hispanic African American: Yes     Diabetic: No     Tobacco smoker: No     Systolic Blood Pressure: 102 mmHg     Is BP treated: No     HDL Cholesterol: 65 mg/dL     Total Cholesterol: 175 mg/dL   Past Medical History:  Diagnosis Date  . Allergy     No current outpatient medications on file.   No current facility-administered medications for this visit.     Allergies: No Known Allergies  History reviewed. No pertinent surgical history.  Social History   Socioeconomic History  . Marital status: Married    Spouse name: Not on file  . Number of children: 5  . Years of education: Not on file  . Highest education level: Not on file  Occupational History  . Occupation: CNA  Social Needs  . Financial resource strain: Not on file  . Food insecurity:    Worry: Not on file    Inability: Not on file  . Transportation needs:    Medical: Not on file    Non-medical: Not on file  Tobacco Use  . Smoking status: Never Smoker  . Smokeless tobacco: Never Used  Substance and Sexual Activity  . Alcohol use: No  . Drug  use: No  . Sexual activity: Yes  Lifestyle  . Physical activity:    Days per week: Not on file    Minutes per session: Not on file  . Stress: Not on file  Relationships  . Social connections:    Talks on phone: Not on file    Gets together: Not on file    Attends religious service: Not on file    Active member of club or organization: Not on file    Attends meetings of clubs or organizations: Not on file    Relationship status: Not on file  Other Topics Concern  . Not on file  Social History Narrative  . Not on file    History reviewed. No pertinent family history.   ROS Review of Systems See HPI Constitution: No fevers or chills No malaise No diaphoresis Skin: No rash or itching Eyes: no blurry vision, no double vision GU: no dysuria or hematuria Neuro: no dizziness or headaches all others reviewed and negative   Objective: Vitals:   06/21/17 0916  BP: 102/68  Pulse: 73  Resp: 16  Temp: 98 F (36.7 C)  TempSrc: Oral  SpO2: 98%  Weight: 135 lb 12.8 oz (61.6 kg)  Height: 5' 2.21" (1.58 m)    Physical Exam  Constitutional: She is oriented to person, place, and time. She appears well-developed and well-nourished.  HENT:  Head: Normocephalic and atraumatic.  Eyes: Conjunctivae and EOM are normal.  Cardiovascular: Normal rate, regular rhythm and normal heart sounds.  No murmur heard. Pulses:      Dorsalis pedis pulses are 2+ on the right side, and 2+ on the left side.       Posterior tibial pulses are 2+ on the right side, and 2+ on the left side.  Pulmonary/Chest: Effort normal and breath sounds normal. No stridor. No respiratory distress. She has no wheezes.  Musculoskeletal: She exhibits no edema.       Right foot: There is normal range of motion and no deformity.       Left foot: There is normal range of motion and no deformity.  Ankle nontender over the malleolus   Feet:  Right Foot:  Skin Integrity: Negative for ulcer, blister, skin breakdown,  erythema, warmth, callus or dry skin.  Left Foot:  Skin Integrity: Negative for ulcer, blister, skin breakdown, erythema, warmth, callus or dry skin.  Neurological: She is alert and oriented to person, place, and time.  Skin: Skin is warm. Capillary refill takes less than 2 seconds.  Psychiatric: She has a normal mood and affect. Her behavior is normal. Judgment and thought content normal.   RIGHT ANKLE - COMPLETE 3+ VIEW  COMPARISON:  None.  FINDINGS: There is no evidence of fracture, dislocation, or joint effusion. There is no evidence of arthropathy or other focal bone abnormality. Soft tissues are unremarkable.  IMPRESSION: Negative.   Electronically Signed   By: Signa Kell M.D.   On: 06/21/2017 10:17  LEFT ANKLE COMPLETE - 3+ VIEW  COMPARISON:  None  FINDINGS: There is no evidence of fracture, dislocation, or joint effusion. There is no evidence of arthropathy or other focal bone abnormality. Small plantar heel spur noted. Soft tissues are unremarkable.  IMPRESSION: 1. No acute findings. 2. Heel spur.   Electronically Signed   By: Signa Kell M.D.   On: 06/21/2017 10:15   Assessment and Plan Shirell was seen today for ankle pain.  Diagnoses and all orders for this visit:  Bilateral swelling of feet and ankles-- advised compression stockings on work days Will check labs -     TSH -     Comprehensive metabolic panel  Screening, lipid -     Lipid panel -     Comprehensive metabolic panel  Screening for diabetes mellitus -     Hemoglobin A1c -     Comprehensive metabolic panel  Acute bilateral ankle pain- likely mechanical in the heel but no findings on exam in the ankle -     DG Ankle Complete Left; Future -     DG Ankle Complete Right; Future  Calcaneal spur of left foot- discussed that she should see orthopedics  Gave handout  Referral placed -     Ambulatory referral to Podiatry     Takeo Harts A Creta Levin

## 2017-06-22 LAB — LIPID PANEL
CHOL/HDL RATIO: 2.9 ratio (ref 0.0–4.4)
Cholesterol, Total: 207 mg/dL — ABNORMAL HIGH (ref 100–199)
HDL: 72 mg/dL (ref >39)
LDL Calculated: 122 mg/dL — ABNORMAL HIGH (ref 0–99)
Triglycerides: 64 mg/dL (ref 0–149)
VLDL Cholesterol Cal: 13 mg/dL (ref 5–40)

## 2017-06-22 LAB — COMPREHENSIVE METABOLIC PANEL
ALK PHOS: 80 IU/L (ref 39–117)
ALT: 19 IU/L (ref 0–32)
AST: 23 IU/L (ref 0–40)
Albumin/Globulin Ratio: 1.1 — ABNORMAL LOW (ref 1.2–2.2)
Albumin: 3.9 g/dL (ref 3.5–5.5)
BILIRUBIN TOTAL: 0.3 mg/dL (ref 0.0–1.2)
BUN/Creatinine Ratio: 14 (ref 9–23)
BUN: 12 mg/dL (ref 6–24)
CHLORIDE: 107 mmol/L — AB (ref 96–106)
CO2: 23 mmol/L (ref 20–29)
Calcium: 9.7 mg/dL (ref 8.7–10.2)
Creatinine, Ser: 0.85 mg/dL (ref 0.57–1.00)
GFR calc Af Amer: 88 mL/min/{1.73_m2} (ref >59)
GFR calc non Af Amer: 76 mL/min/{1.73_m2} (ref >59)
GLUCOSE: 87 mg/dL (ref 65–99)
Globulin, Total: 3.4 g/dL (ref 1.5–4.5)
Potassium: 5.2 mmol/L (ref 3.5–5.2)
Sodium: 142 mmol/L (ref 134–144)
Total Protein: 7.3 g/dL (ref 6.0–8.5)

## 2017-06-22 LAB — TSH: TSH: 0.375 u[IU]/mL — ABNORMAL LOW (ref 0.450–4.500)

## 2017-06-22 LAB — HEMOGLOBIN A1C
Est. average glucose Bld gHb Est-mCnc: 105 mg/dL
HEMOGLOBIN A1C: 5.3 % (ref 4.8–5.6)

## 2017-06-25 ENCOUNTER — Telehealth: Payer: Self-pay | Admitting: Family Medicine

## 2017-06-25 NOTE — Telephone Encounter (Signed)
Pt  Called returning VM  From [place]  Results  Not in [facility] results que CRM [phone] reviewed   Pt  Advised  Of  Result  Notes  Abnormal thyroid  She  Was  Advised  Plan of care   In regards to  appt  She  Already has a  followup  appt on  April  8    [facility]  [facility]  Who reviewed chart as    Well

## 2017-06-30 ENCOUNTER — Ambulatory Visit: Payer: BLUE CROSS/BLUE SHIELD | Admitting: Family Medicine

## 2017-06-30 ENCOUNTER — Ambulatory Visit: Payer: Managed Care, Other (non HMO) | Admitting: Family Medicine

## 2017-06-30 ENCOUNTER — Encounter: Payer: Self-pay | Admitting: Family Medicine

## 2017-06-30 ENCOUNTER — Other Ambulatory Visit: Payer: Self-pay

## 2017-06-30 ENCOUNTER — Encounter: Payer: Self-pay | Admitting: *Deleted

## 2017-06-30 VITALS — BP 98/62 | HR 93 | Temp 98.3°F | Ht 63.39 in | Wt 134.8 lb

## 2017-06-30 DIAGNOSIS — E7800 Pure hypercholesterolemia, unspecified: Secondary | ICD-10-CM

## 2017-06-30 DIAGNOSIS — Z1159 Encounter for screening for other viral diseases: Secondary | ICD-10-CM | POA: Diagnosis not present

## 2017-06-30 DIAGNOSIS — Z1231 Encounter for screening mammogram for malignant neoplasm of breast: Secondary | ICD-10-CM

## 2017-06-30 DIAGNOSIS — R946 Abnormal results of thyroid function studies: Secondary | ICD-10-CM

## 2017-06-30 DIAGNOSIS — Z1239 Encounter for other screening for malignant neoplasm of breast: Secondary | ICD-10-CM

## 2017-06-30 DIAGNOSIS — M7989 Other specified soft tissue disorders: Secondary | ICD-10-CM

## 2017-06-30 NOTE — Patient Instructions (Addendum)
We recommend that you schedule a mammogram for breast cancer screening. Typically, you do not need a referral to do this. Please contact a local imaging center to schedule your mammogram.  [facility] - [phone]) [phone]  *ask for the Radiology Department [facility]) - [phone](336) [phone] or [phone]  [facility] - [phone](336) [phone] [facility] - [phone] [facility] - [phone](336) [phone]  *ask for the Radiology Department [facility] - [phone]) [phone]  *ask for the Radiology Department [facility] - [phone]  *ask for the Mammography Department [facility] - [phone](336) [phone]   IF you received an x-ray today, you will receive an invoice from [facility]. Please contact [facility] at [phone] with questions or concerns regarding your invoice.   IF you received labwork today, you will receive an invoice from [facility]. Please contact [facility] at [phone] with questions or concerns regarding your invoice.   Our billing staff will not be able to assist you with questions regarding bills from these companies.  You will be contacted with the lab results as soon as they are available. The fastest way to get your results is to activate your My Chart account. Instructions are located on the last page of this paperwork. If you have not heard from us regarding the results in 2 weeks, please contact this office.     High Cholesterol High cholesterol is a condition in which the blood has high levels of a white, waxy, fat-like substance (cholesterol). The human body needs small amounts of cholesterol. The liver makes all the cholesterol that the body needs. Extra (excess) cholesterol comes from the food that we eat. Cholesterol is carried from the liver by the blood through the blood vessels. If you have high cholesterol, deposits (plaques) may build up on the walls of your blood  vessels (arteries). Plaques make the arteries narrower and stiffer. Cholesterol plaques increase your risk for heart attack and stroke. Work with your health care provider to keep your cholesterol levels in a healthy range. What increases the risk? This condition is more likely to develop in people who:  Eat foods that are high in animal fat (saturated fat) or cholesterol.  Are overweight.  Are not getting enough exercise.  Have a family history of high cholesterol.  What are the signs or symptoms? There are no symptoms of this condition. How is this diagnosed? This condition may be diagnosed from the results of a blood test.  If you are older than age 58, your health care provider may check your cholesterol every 4-6 years.  You may be checked more often if you already have high cholesterol or other risk factors for heart disease.  The blood test for cholesterol measures:  "Bad" cholesterol (LDL cholesterol). This is the main type of cholesterol that causes heart disease. The desired level for LDL is less than 100.  "Good" cholesterol (HDL cholesterol). This type helps to protect against heart disease by cleaning the arteries and carrying the LDL away. The desired level for HDL is 60 or higher.  Triglycerides. These are fats that the body can store or burn for energy. The desired number for triglycerides is lower than 150.  Total cholesterol. This is a measure of the total amount of cholesterol in your blood, including LDL cholesterol, HDL cholesterol, and triglycerides. A healthy number is less than 200.  How is this treated? This condition is treated with diet changes, lifestyle changes, and  medicines. Diet changes  This may include eating more whole grains, fruits, vegetables, nuts, and fish.  This may also include cutting back on red meat and foods that have a lot of added sugar. Lifestyle changes  Changes may include getting at least 40 minutes of aerobic exercise 3  times a week. Aerobic exercises include walking, biking, and swimming. Aerobic exercise along with a healthy diet can help you maintain a healthy weight.  Changes may also include quitting smoking. Medicines  Medicines are usually given if diet and lifestyle changes have failed to reduce your cholesterol to healthy levels.  Your health care provider may prescribe a statin medicine. Statin medicines have been shown to reduce cholesterol, which can reduce the risk of heart disease. Follow these instructions at home: Eating and drinking  If told by your health care provider:  Eat chicken (without skin), fish, veal, shellfish, ground [place] breast, and round or loin cuts of red meat.  Do not eat fried foods or fatty meats, such as hot dogs and salami.  Eat plenty of fruits, such as apples.  Eat plenty of vegetables, such as broccoli, potatoes, and carrots.  Eat beans, peas, and lentils.  Eat grains such as barley, rice, couscous, and bulgur wheat.  Eat pasta without cream sauces.  Use skim or nonfat milk, and eat low-fat or nonfat yogurt and cheeses.  Do not eat or drink whole milk, cream, ice cream, egg yolks, or hard cheeses.  Do not eat stick margarine or tub margarines that contain trans fats (also called partially hydrogenated oils).  Do not eat saturated tropical oils, such as coconut oil and palm oil.  Do not eat cakes, cookies, crackers, or other baked goods that contain trans fats.  General instructions  Exercise as directed by your health care provider. Increase your activity level with activities such as gardening, walking, and taking the stairs.  Take over-the-counter and prescription medicines only as told by your health care provider.  Do not use any products that contain nicotine or tobacco, such as cigarettes and e-cigarettes. If you need help quitting, ask your health care provider.  Keep all follow-up visits as told by your health care provider. This is  important. Contact a health care provider if:  You are struggling to maintain a healthy diet or weight.  You need help to start on an exercise program.  You need help to stop smoking. Get help right away if:  You have chest pain.  You have trouble breathing. This information is not intended to replace advice given to you by your health care provider. Make sure you discuss any questions you have with your health care provider. Document Released: 03/11/2005 Document Revised: 10/07/2015 Document Reviewed: 09/09/2015 Elsevier Interactive Patient Education  [employer].

## 2017-06-30 NOTE — Progress Notes (Signed)
Subjective:    Patient ID: Teresa BottomsAsselefech Vanyo, female    DOB: [birth year], 57 y.o.   MRN: [id number]  06/30/2017  Follow-up (follow up on left foot pain and right ankle pain. Imaging done on the 30th of March with no findings) and Results (Was told to make an appt to speak to PCP due to lab results)    HPI This 57 y.o. female presents for evaluation of LEFT foot pain and RIGHT ankle pain with leg swelling B.  Evaluated by Dr.Stallings on 06/21/17 for leg swelling and ankle pain B.  [facility] L>R [facility]; no trauma; no limp with walking; no swelling in mornings.  Underwent RIGHT ankle xray that was negative on  06/21/17.   LEFT ankle xray showed heel spur only. Dr. Creta LevinStallings obtained TSH, CMET, Lipid panel, HgbA1c; compression stockings recommended; pt referred to podiatry. TSH 0.375 and LDL 122 and additional testing recommended by Dr. Creta LevinStallings.   L lateral ankle pain: onset three weeks ago.  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 Pain at nighttime; no pain during working;distracted by work; plans to get compression stockings.  Swelling lateral ankle.  Onset two months ago. Denies CP/DOE/orthopnea. No history of thyroid problems.    BP Readings from Last 3 Encounters:  06/30/17 98/62  06/21/17 102/68  01/06/16 102/66   Wt Readings from Last 3 Encounters:  06/30/17 134 lb 12.8 oz (61.1 kg)  06/21/17 135 lb 12.8 oz (61.6 kg)  01/06/16 134 lb (60.8 kg)   Immunization History  Administered Date(s) Administered  . Influenza Split 01/22/2012  . PPD Test 12/26/2012    Review of Systems  Constitutional: Negative for chills, diaphoresis, fatigue and fever.  Eyes: Negative for visual disturbance.  Respiratory: Negative for cough and shortness of breath.   Cardiovascular: Positive for leg swelling. Negative for chest pain and palpitations.  Gastrointestinal: Negative for abdominal pain, constipation, diarrhea, nausea and vomiting.  Endocrine: Negative for cold  intolerance, heat intolerance, polydipsia, polyphagia and polyuria.  Musculoskeletal: Positive for joint swelling. Negative for arthralgias.  Neurological: Negative for dizziness, tremors, seizures, syncope, facial asymmetry, speech difficulty, weakness, light-headedness, numbness and headaches.    Past Medical History:  Diagnosis Date  . Allergy    History reviewed. No pertinent surgical history. No Known Allergies No current outpatient medications on file prior to visit.   No current facility-administered medications on file prior to visit.    Social History   Socioeconomic History  . Marital status: Married    Spouse name: Not on file  . Number of children: 5  . Years of education: Not on file  . Highest education level: Not on file  Occupational History  . Occupation: CNA  Social Needs  . Financial resource strain: Not on file  . Food insecurity:    Worry: Not on file    Inability: Not on file  . Transportation needs:    Medical: Not on file    Non-medical: Not on file  Tobacco Use  . Smoking status: Never Smoker  . Smokeless tobacco: Never Used  Substance and Sexual Activity  . Alcohol use: No  . Drug use: No  . Sexual activity: Yes  Lifestyle  . Physical activity:    Days per week: Not on file    Minutes per session: Not on file  . Stress: Not on file  Relationships  . Social connections:    Talks on phone: Not on file    Gets together: Not on file  Attends religious service: Not on file    Active member of club or organization: Not on file    Attends meetings of clubs or organizations: Not on file    Relationship status: Not on file  . Intimate partner violence:    Fear of current or ex partner: Not on file    Emotionally abused: Not on file    Physically abused: Not on file    Forced sexual activity: Not on file  Other Topics Concern  . Not on file  Social History Narrative  . Not on file   History reviewed. No pertinent family history.       Objective:    BP 98/62 (BP Location: Left Arm, Patient Position: Sitting, Cuff Size: Normal)   Pulse 93   Temp 98.3 F (36.8 C) (Oral)   Ht 5' 3.39" (1.61 m)   Wt 134 lb 12.8 oz (61.1 kg)   SpO2 97%   BMI 23.59 kg/m  Physical Exam  Constitutional: She is oriented to person, place, and time. She appears well-developed and well-nourished. No distress.  HENT:  Head: Normocephalic and atraumatic.  Right Ear: External ear normal.  Left Ear: External ear normal.  Nose: Nose normal.  Mouth/Throat: Oropharynx is clear and moist.  Eyes: Pupils are equal, round, and reactive to light. Conjunctivae and EOM are normal.  Neck: Normal range of motion. Neck supple. Carotid bruit is not present. No thyromegaly present.  Cardiovascular: Normal rate, regular rhythm, normal heart sounds and intact distal pulses. Exam reveals no gallop and no friction rub.  No murmur heard. Trace non-pitting edema to proximal ankles B.  Pulmonary/Chest: Effort normal and breath sounds normal. She has no wheezes. She has no rales.  Abdominal: Soft. Bowel sounds are normal. She exhibits no distension and no mass. There is no tenderness. There is no rebound and no guarding.  Musculoskeletal: She exhibits edema.       Right ankle: Normal. She exhibits normal range of motion and no ecchymosis. No tenderness. No lateral malleolus and no medial malleolus tenderness found. Achilles tendon normal.       Left ankle: Normal. She exhibits normal range of motion and no ecchymosis. No tenderness. No lateral malleolus and no medial malleolus tenderness found. Achilles tendon normal.       Right foot: Normal. There is normal range of motion, no tenderness and no bony tenderness.       Left foot: Normal. There is normal range of motion, no tenderness and no bony tenderness.  Lymphadenopathy:    She has no cervical adenopathy.  Neurological: She is alert and oriented to person, place, and time. No cranial nerve deficit.  Skin: Skin is  warm and dry. No rash noted. She is not diaphoretic. No erythema. No pallor.  Psychiatric: She has a normal mood and affect. Her behavior is normal.   No results found. Depression screen [facility] 2/9 06/30/2017 06/21/2017 01/06/2016 05/15/2015  Decreased Interest 0 0 0 0  Down, Depressed, Hopeless 0 0 0 0  PHQ - 2 Score 0 0 0 0   Fall Risk  06/30/2017 06/21/2017 01/06/2016 05/15/2015  Falls in the past year? No No No No        Assessment & Plan:   1. Thyroid function study abnormality   2. Leg swelling   3. Pure hypercholesterolemia   4. Need for hepatitis C screening test   5. Breast cancer screening    Slightly abnormal TSH: With recent visit with Dr. Creta Levin; thus, will  repeat TSH and free T4 today.  There is a possibility that thyroid abnormality is contributing to lower extremity edema.  Bilateral leg swelling: Persistent and very mild at this time.  No associated CHF symptoms.  Repeat TSH.  Obtain urinalysis.  Obtain EKG to rule out cardiac source.  Status post bilateral ankle films that were both negative.  Leg swelling is not consistent with a primary musculoskeletal etiology.  Patient does have upcoming appointment with podiatry for further evaluation.  Recommend compression stockings.  Hypercholesterolemia: I recommend weight loss, exercise, and low-cholesterol low-fat food choices.  I recommend limiting red meat to once per week; I recommend limiting fried foods to once per month.  Breast cancer screening: Refer for mammography.  Current breast cancer screening guidelines reviewed with patient.  Hepatitis C screening: Per current CDC guidelines, obtain hepatitis C screening.   Orders Placed This Encounter  Procedures  . MM Digital Screening    Standing Status:   Future    Standing Expiration Date:   08/31/2018    Order Specific Question:   Reason for Exam (SYMPTOM  OR DIAGNOSIS REQUIRED)    Answer:   annual screening    Order Specific Question:   Is the patient pregnant?     Answer:   No    Order Specific Question:   Preferred imaging location?    Answer:   [facility]  . Urinalysis, dipstick only  . TSH  . T4, free  . Thyroid Stimulating Immunoglobulin  . Thyroid antibodies  . Hepatitis C antibody  . EKG 12-Lead   No orders of the defined types were placed in this encounter.   No follow-ups on file.   Osie Amparo Paulita Fujita, M.D. Primary Care at [facility] [address] [place], [place]  [address] [phone] phone [phone] fax

## 2017-07-01 LAB — THYROID STIMULATING IMMUNOGLOBULIN: Thyroid Stim Immunoglobulin: <0.1 IU/L (ref 0.00–0.55)

## 2017-07-01 LAB — URINALYSIS, DIPSTICK ONLY
BILIRUBIN UA: NEGATIVE
Glucose, UA: NEGATIVE
Ketones, UA: NEGATIVE
LEUKOCYTES UA: NEGATIVE
Nitrite, UA: NEGATIVE
PROTEIN UA: NEGATIVE
RBC UA: NEGATIVE
SPEC GRAV UA: 1.023 (ref 1.005–1.030)
Urobilinogen, Ur: 0.2 mg/dL (ref 0.2–1.0)
pH, UA: 5.5 (ref 5.0–7.5)

## 2017-07-01 LAB — HEPATITIS C ANTIBODY: Hep C Virus Ab: <0.1 s/co ratio (ref 0.0–0.9)

## 2017-07-01 LAB — THYROID ANTIBODIES
Thyroglobulin Antibody: <1 IU/mL (ref 0.0–0.9)
Thyroperoxidase Ab SerPl-aCnc: 12 IU/mL (ref 0–34)

## 2017-07-01 LAB — TSH: TSH: 0.306 u[IU]/mL — AB (ref 0.450–4.500)

## 2017-07-01 LAB — T4, FREE: Free T4: 1.2 ng/dL (ref 0.82–1.77)

## 2017-07-02 ENCOUNTER — Ambulatory Visit: Payer: Managed Care, Other (non HMO) | Admitting: Family Medicine

## 2017-07-05 ENCOUNTER — Encounter: Payer: Self-pay | Admitting: Family Medicine

## 2017-07-05 ENCOUNTER — Ambulatory Visit: Payer: BLUE CROSS/BLUE SHIELD | Admitting: Family Medicine

## 2017-07-05 VITALS — BP 118/74 | HR 75 | Temp 98.2°F | Resp 16 | Ht 63.0 in | Wt 135.0 lb

## 2017-07-05 DIAGNOSIS — E0590 Thyrotoxicosis, unspecified without thyrotoxic crisis or storm: Secondary | ICD-10-CM | POA: Diagnosis not present

## 2017-07-05 NOTE — Patient Instructions (Signed)
1. You have mild subclinical hyperthyroidism. At this point we can just monitor clinically. Labs should be repeated in 6 months. Please follow-up with your PCP for this. I have provided you with an educational handout.

## 2017-07-05 NOTE — Progress Notes (Signed)
   4/13/20192:53 PM  Teresa Morrison 10/21/1960, 57 y.o. female [id number]  Chief Complaint  Patient presents with  . Go Over Labs    pt here today to review lab results    HPI:   Patient is a 57 y.o. female who presents today for followup on abnormal thyroid labs  TSH checked for bilateral LE edema Found to be mildly decreased, normal FT4 and negative TPO, TSI and TG antibodies Patient denies any neck swelling or pain, dysphagia, palpitations, changes in temp tolerance, changes is skin or hair, changes in temp tolerance, changes in mood.  She denies any use of biotin containing supplements, h/o neck radiation, fhx thyroid disorders Menopausal about 14 years ago She eats adequate sources of calcium and vitamin d  Depression screen [facility] 2/9 06/30/2017 06/21/2017 01/06/2016  Decreased Interest 0 0 0  Down, Depressed, Hopeless 0 0 0  PHQ - 2 Score 0 0 0    No Known Allergies  Prior to Admission medications   Not on File    Past Medical History:  Diagnosis Date  . Allergy     History reviewed. No pertinent surgical history.  Social History   Tobacco Use  . Smoking status: Never Smoker  . Smokeless tobacco: Never Used  Substance Use Topics  . Alcohol use: No    History reviewed. No pertinent family history.  ROS Per hpi  OBJECTIVE:  Blood pressure 118/74, pulse 75, temperature 98.2 F (36.8 C), temperature source Oral, resp. rate 16, height 5\' 3"  (1.6 m), weight 135 lb (61.2 kg), SpO2 99 %.  Physical Exam  Constitutional: She is oriented to person, place, and time.  HENT:  Head: Normocephalic and atraumatic.  Mouth/Throat: Mucous membranes are normal.  Eyes: Pupils are equal, round, and reactive to light. EOM are normal. No scleral icterus.  Neck: Neck supple. No thyroid mass and no thyromegaly present.  Pulmonary/Chest: Effort normal.  Neurological: She is alert and oriented to person, place, and time.  Skin: Skin is warm and dry.  Nursing note and  vitals reviewed.    ASSESSMENT and PLAN  1. Subclinical hyperthyroidism Asymptomatic, TSH > 0.1, no other risk factors, clinical observation appropriate with repeat labs in 6 months. Patient educational handout given. RTC precautions reviewed.   Return in about 6 months (around 01/04/2018).    Myles LippsIrma M Santiago, MD Primary Care at [facility] [address] [place], [place] [address] Ph.  [phone] Fax [phone]

## 2017-07-08 ENCOUNTER — Ambulatory Visit: Payer: BLUE CROSS/BLUE SHIELD | Admitting: Sports Medicine

## 2017-07-08 ENCOUNTER — Encounter: Payer: Self-pay | Admitting: Sports Medicine

## 2017-07-08 ENCOUNTER — Ambulatory Visit: Payer: Self-pay

## 2017-07-08 DIAGNOSIS — M79672 Pain in left foot: Secondary | ICD-10-CM | POA: Diagnosis not present

## 2017-07-08 DIAGNOSIS — M722 Plantar fascial fibromatosis: Secondary | ICD-10-CM

## 2017-07-08 NOTE — Patient Instructions (Addendum)
Get over the counter compression stocking or support sock to help with swelling that you have in the ankles  Recommend you purchase heel cushions to help with your heel pain over the counter  Plantar Fasciitis (Heel Spur Syndrome) with Rehab The plantar fascia is a fibrous, ligament-like, soft-tissue structure that spans the bottom of the foot. Plantar fasciitis is a condition that causes pain in the foot due to inflammation of the tissue. SYMPTOMS   Pain and tenderness on the underneath side of the foot.  Pain that worsens with standing or walking. CAUSES  Plantar fasciitis is caused by irritation and injury to the plantar fascia on the underneath side of the foot. Common mechanisms of injury include:  Direct trauma to bottom of the foot.  Damage to a small nerve that runs under the foot where the main fascia attaches to the heel bone.  Stress placed on the plantar fascia due to bone spurs. RISK INCREASES WITH:   Activities that place stress on the plantar fascia (running, jumping, pivoting, or cutting).  Poor strength and flexibility.  Improperly fitted shoes.  Tight calf muscles.  Flat feet.  Failure to warm-up properly before activity.  Obesity. PREVENTION  Warm up and stretch properly before activity.  Allow for adequate recovery between workouts.  Maintain physical fitness:  Strength, flexibility, and endurance.  Cardiovascular fitness.  Maintain a health body weight.  Avoid stress on the plantar fascia.  Wear properly fitted shoes, including arch supports for individuals who have flat feet.  PROGNOSIS  If treated properly, then the symptoms of plantar fasciitis usually resolve without surgery. However, occasionally surgery is necessary.  RELATED COMPLICATIONS   Recurrent symptoms that may result in a chronic condition.  Problems of the lower back that are caused by compensating for the injury, such as limping.  Pain or weakness of the foot during  push-off following surgery.  Chronic inflammation, scarring, and partial or complete fascia tear, occurring more often from repeated injections.  TREATMENT  Treatment initially involves the use of ice and medication to help reduce pain and inflammation. The use of strengthening and stretching exercises may help reduce pain with activity, especially stretches of the Achilles tendon. These exercises may be performed at home or with a therapist. Your caregiver may recommend that you use heel cups of arch supports to help reduce stress on the plantar fascia. Occasionally, corticosteroid injections are given to reduce inflammation. If symptoms persist for greater than 6 months despite non-surgical (conservative), then surgery may be recommended.   MEDICATION   If pain medication is necessary, then nonsteroidal anti-inflammatory medications, such as aspirin and ibuprofen, or other minor pain relievers, such as acetaminophen, are often recommended.  Do not take pain medication within 7 days before surgery.  Prescription pain relievers may be given if deemed necessary by your caregiver. Use only as directed and only as much as you need.  Corticosteroid injections may be given by your caregiver. These injections should be reserved for the most serious cases, because they may only be given a certain number of times.  HEAT AND COLD  Cold treatment (icing) relieves pain and reduces inflammation. Cold treatment should be applied for 10 to 15 minutes every 2 to 3 hours for inflammation and pain and immediately after any activity that aggravates your symptoms. Use ice packs or massage the area with a piece of ice (ice massage).  Heat treatment may be used prior to performing the stretching and strengthening activities prescribed by your caregiver, physical  therapist, or athletic trainer. Use a heat pack or soak the injury in warm water.  SEEK IMMEDIATE MEDICAL CARE IF:  Treatment seems to offer no benefit,  or the condition worsens.  Any medications produce adverse side effects.  EXERCISES- RANGE OF MOTION (ROM) AND STRETCHING EXERCISES - Plantar Fasciitis (Heel Spur Syndrome) These exercises may help you when beginning to rehabilitate your injury. Your symptoms may resolve with or without further involvement from your physician, physical therapist or athletic trainer. While completing these exercises, remember:   Restoring tissue flexibility helps normal motion to return to the joints. This allows healthier, less painful movement and activity.  An effective stretch should be held for at least 30 seconds.  A stretch should never be painful. You should only feel a gentle lengthening or release in the stretched tissue.  RANGE OF MOTION - Toe Extension, Flexion  Sit with your right / left leg crossed over your opposite knee.  Grasp your toes and gently pull them back toward the top of your foot. You should feel a stretch on the bottom of your toes and/or foot.  Hold this stretch for 10 seconds.  Now, gently pull your toes toward the bottom of your foot. You should feel a stretch on the top of your toes and or foot.  Hold this stretch for 10 seconds. Repeat  times. Complete this stretch 3 times per day.   RANGE OF MOTION - Ankle Dorsiflexion, Active Assisted  Remove shoes and sit on a chair that is preferably not on a carpeted surface.  Place right / left foot under knee. Extend your opposite leg for support.  Keeping your heel down, slide your right / left foot back toward the chair until you feel a stretch at your ankle or calf. If you do not feel a stretch, slide your bottom forward to the edge of the chair, while still keeping your heel down.  Hold this stretch for 10 seconds. Repeat 3 times. Complete this stretch 2 times per day.   STRETCH  Gastroc, Standing  Place hands on wall.  Extend right / left leg, keeping the front knee somewhat bent.  Slightly point your toes inward  on your back foot.  Keeping your right / left heel on the floor and your knee straight, shift your weight toward the wall, not allowing your back to arch.  You should feel a gentle stretch in the right / left calf. Hold this position for 10 seconds. Repeat 3 times. Complete this stretch 2 times per day.  STRETCH  Soleus, Standing  Place hands on wall.  Extend right / left leg, keeping the other knee somewhat bent.  Slightly point your toes inward on your back foot.  Keep your right / left heel on the floor, bend your back knee, and slightly shift your weight over the back leg so that you feel a gentle stretch deep in your back calf.  Hold this position for 10 seconds. Repeat 3 times. Complete this stretch 2 times per day.  STRETCH  Gastrocsoleus, Standing  Note: This exercise can place a lot of stress on your foot and ankle. Please complete this exercise only if specifically instructed by your caregiver.   Place the ball of your right / left foot on a step, keeping your other foot firmly on the same step.  Hold on to the wall or a rail for balance.  Slowly lift your other foot, allowing your body weight to press your heel down over the  edge of the step.  You should feel a stretch in your right / left calf.  Hold this position for 10 seconds.  Repeat this exercise with a slight bend in your right / left knee. Repeat 3 times. Complete this stretch 2 times per day.   STRENGTHENING EXERCISES - Plantar Fasciitis (Heel Spur Syndrome)  These exercises may help you when beginning to rehabilitate your injury. They may resolve your symptoms with or without further involvement from your physician, physical therapist or athletic trainer. While completing these exercises, remember:   Muscles can gain both the endurance and the strength needed for everyday activities through controlled exercises.  Complete these exercises as instructed by your physician, physical therapist or athletic  trainer. Progress the resistance and repetitions only as guided.  STRENGTH - Towel Curls  Sit in a chair positioned on a non-carpeted surface.  Place your foot on a towel, keeping your heel on the floor.  Pull the towel toward your heel by only curling your toes. Keep your heel on the floor. Repeat 3 times. Complete this exercise 2 times per day.  STRENGTH - Ankle Inversion  Secure one end of a rubber exercise band/tubing to a fixed object (table, pole). Loop the other end around your foot just before your toes.  Place your fists between your knees. This will focus your strengthening at your ankle.  Slowly, pull your big toe up and in, making sure the band/tubing is positioned to resist the entire motion.  Hold this position for 10 seconds.  Have your muscles resist the band/tubing as it slowly pulls your foot back to the starting position. Repeat 3 times. Complete this exercises 2 times per day.  Document Released: 03/11/2005 Document Revised: 06/03/2011 Document Reviewed: 06/23/2008 [facility] Patient Information 2014 [place], [place].

## 2017-07-08 NOTE — Progress Notes (Signed)
Subjective: Wrenley Nicholos JohnsBekele is a 57 y.o. female patient presents to office with complaint of moderate heel pain on the right>left and sometimes ankle pain. Patient admits to post static dyskinesia for 2 months in duration. Patient has treated this problem with nothing. Denies any other pedal complaints.   Review of Systems  Musculoskeletal:       Heel and occasional ankle pain  All other systems reviewed and are negative.   Patient Active Problem List   Diagnosis Date Noted  . Subclinical hyperthyroidism 07/05/2017  . Pure hypercholesterolemia 06/30/2017    No current outpatient medications on file prior to visit.   No current facility-administered medications on file prior to visit.     No Known Allergies  Objective: Physical Exam General: The patient is alert and oriented x3 in no acute distress.  Dermatology: Skin is warm, dry and supple bilateral lower extremities. Nails 1-10 are normal. There is no erythema, edema, no eccymosis, no open lesions present. Integument is otherwise unremarkable.  Vascular: Dorsalis Pedis pulse and Posterior Tibial pulse are 2/4 bilateral. Capillary fill time is immediate to all digits.  Neurological: Grossly intact to light touch with an achilles reflex of +2/5 and a  negative Tinel's sign bilateral.  Musculoskeletal: Tenderness to palpation at the medial calcaneal tubercale and through the insertion of the plantar fascia on the left and right foot. No pain to ankles; likely compensation pain sometimes, no pain with compression of calcaneus bilateral. No pain with tuning fork to calcaneus bilateral. No pain with calf compression bilateral. There is decreased Ankle joint range of motion bilateral. All other joints range of motion within normal limits bilateral. Strength 5/5 in all groups bilateral.   Assessment and Plan: Problem List Items Addressed This Visit    None    Visit Diagnoses    Plantar fasciitis    -  Primary   Foot pain, left           -Complete examination performed.  -Xrays reviewed from [place] reviewed showing heel spur and minimal lateral ankle arthritis  -Discussed with patient in detail the condition of plantar fasciitis, how this occurs and general treatment options. Explained both conservative and surgical treatments.  -Patient declined injection and meds -Recommended good supportive shoes and advised use of OTC insert/heel cushion especially if padding I dispensed at todays visit helps. -Explained and dispensed to patient daily stretching exercises. -Recommend patient to ice affected area 1-2x daily. -Patient to return to office in 4 weeks for follow up or sooner if problems or questions arise. IF PAIN IS NOT BETTER AT NEXT VISIT RECOMMEND INJECTION AND FASCIAL BRACES.   Asencion Islamitorya Christino Mcglinchey, DPM

## 2017-07-09 ENCOUNTER — Encounter: Payer: Self-pay | Admitting: Family Medicine

## 2017-08-18 ENCOUNTER — Encounter: Payer: Self-pay | Admitting: Family Medicine

## 2019-06-27 IMAGING — DX DG ANKLE COMPLETE 3+V*L*
3 series · 3 of 3 positions shown · non-contrast
Comparison: None

CLINICAL DATA: Bilateral ankle pain.

EXAM:
LEFT ANKLE COMPLETE - 3+ VIEW

[ankle obl]
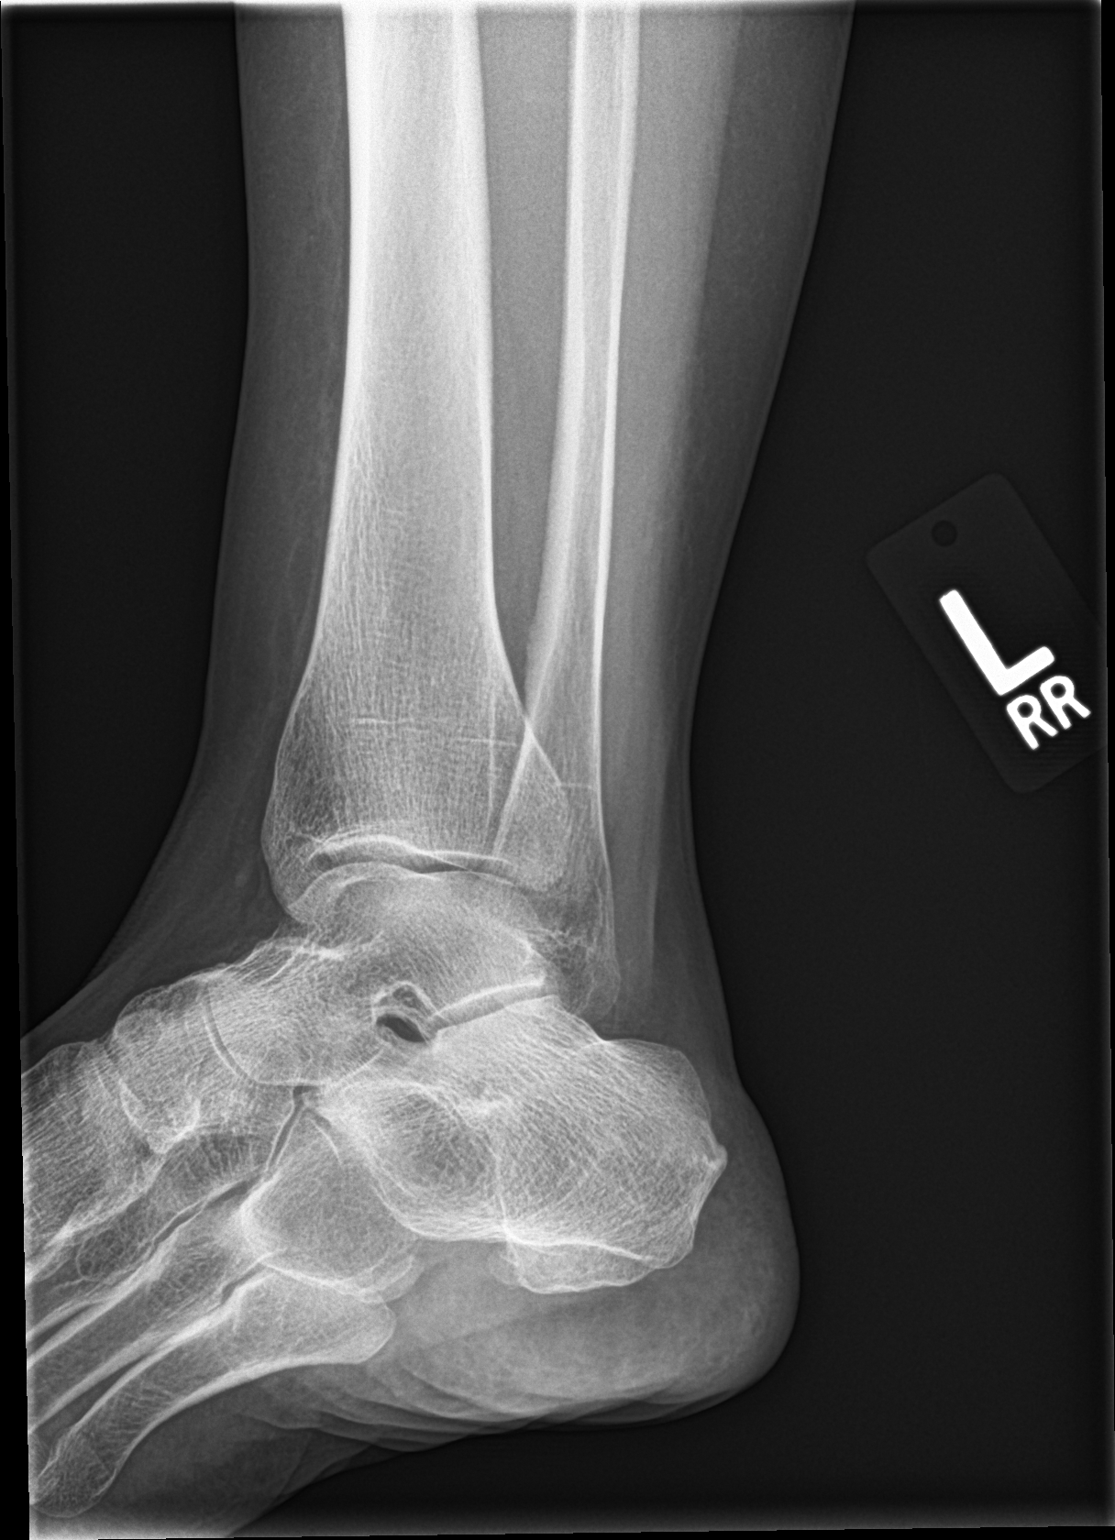

[ankle ap]
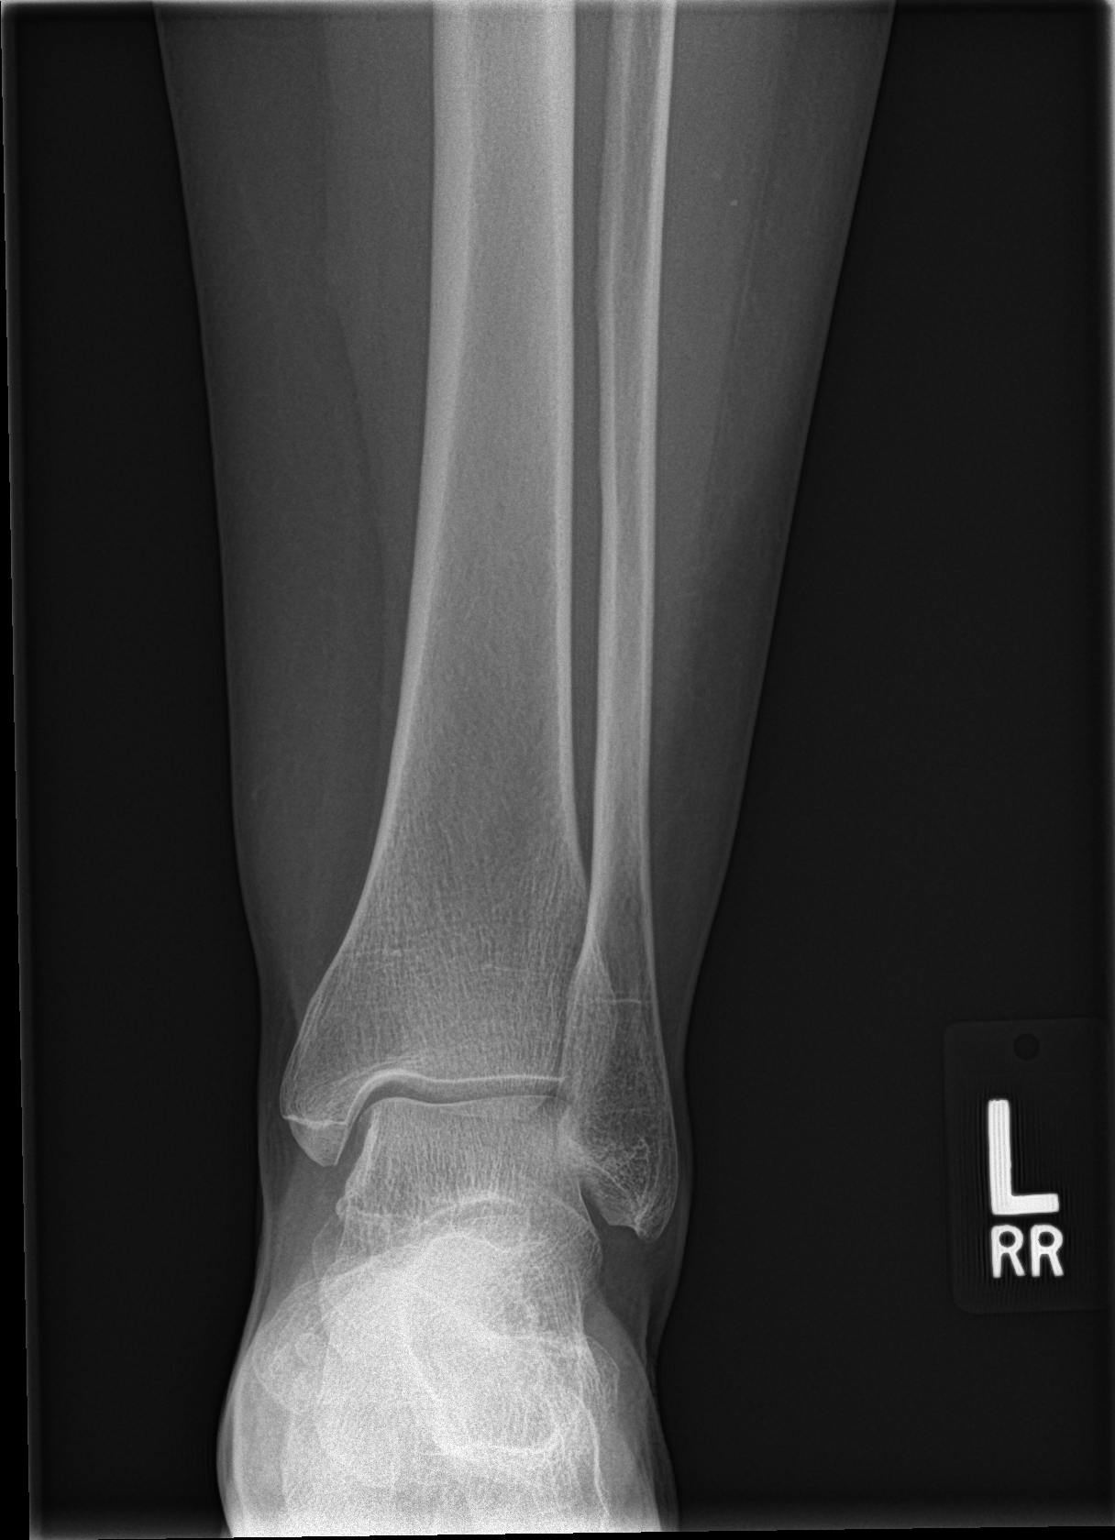

[ankle lat]
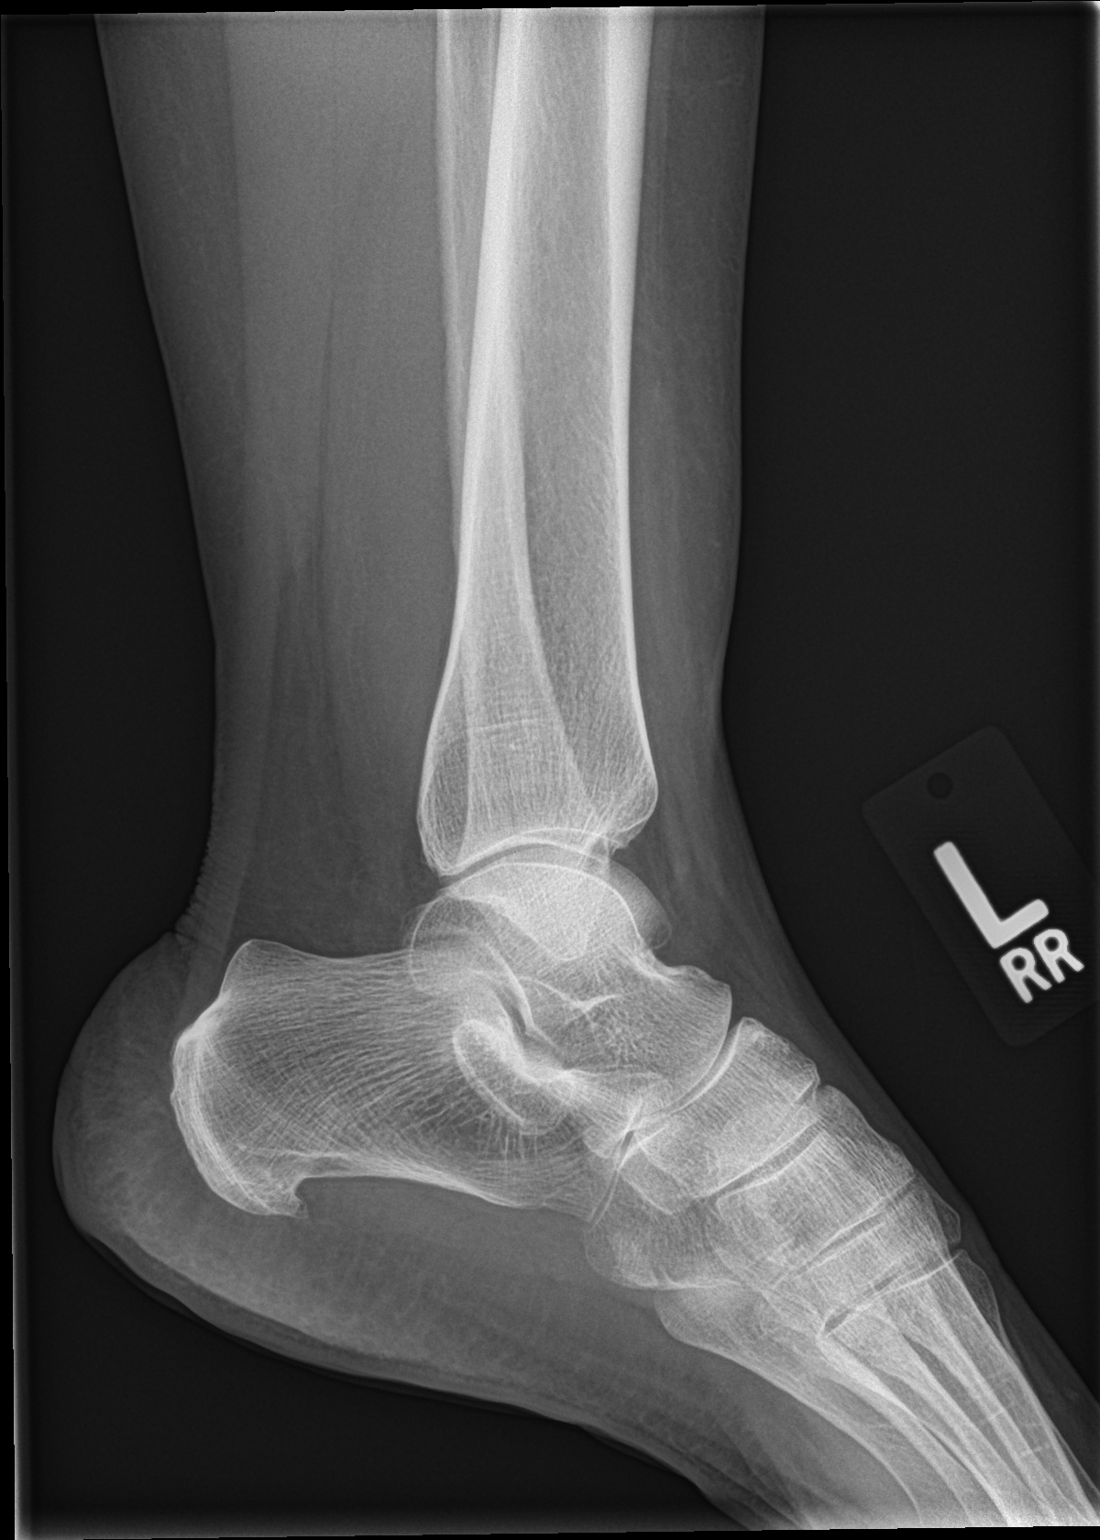

[3 of 3 positions shown; findings below may reference images not displayed]

FINDINGS: There is no evidence of fracture, dislocation, or joint effusion.
There is no evidence of arthropathy or other focal bone abnormality.
Small plantar heel spur noted. Soft tissues are unremarkable.
IMPRESSION: 1. No acute findings.
2. Heel spur.

## 2019-08-02 ENCOUNTER — Encounter: Payer: Self-pay | Admitting: Emergency Medicine

## 2019-08-02 ENCOUNTER — Ambulatory Visit (INDEPENDENT_AMBULATORY_CARE_PROVIDER_SITE_OTHER): Payer: BC Managed Care – PPO | Admitting: Emergency Medicine

## 2019-08-02 ENCOUNTER — Other Ambulatory Visit: Payer: Self-pay

## 2019-08-02 VITALS — BP 118/74 | HR 73 | Temp 98.0°F | Resp 16 | Ht 62.0 in | Wt 136.0 lb

## 2019-08-02 DIAGNOSIS — Z7689 Persons encountering health services in other specified circumstances: Secondary | ICD-10-CM

## 2019-08-02 DIAGNOSIS — Z1231 Encounter for screening mammogram for malignant neoplasm of breast: Secondary | ICD-10-CM | POA: Diagnosis not present

## 2019-08-02 DIAGNOSIS — R1011 Right upper quadrant pain: Secondary | ICD-10-CM

## 2019-08-02 DIAGNOSIS — Z1211 Encounter for screening for malignant neoplasm of colon: Secondary | ICD-10-CM | POA: Diagnosis not present

## 2019-08-02 LAB — POCT URINALYSIS DIP (MANUAL ENTRY)
Bilirubin, UA: NEGATIVE
Blood, UA: NEGATIVE
Glucose, UA: NEGATIVE mg/dL
Ketones, POC UA: NEGATIVE mg/dL
Nitrite, UA: NEGATIVE
Protein Ur, POC: NEGATIVE mg/dL
Spec Grav, UA: 1.015 (ref 1.010–1.025)
Urobilinogen, UA: 0.2 E.U./dL
pH, UA: 5.5 (ref 5.0–8.0)

## 2019-08-02 NOTE — Progress Notes (Signed)
Teresa Morrison 59 y.o.   Chief Complaint  Patient presents with  . Abdominal Pain    RIGHT side for 2 weeks - per pt comes/goes when I feel hungry    HISTORY OF PRESENT ILLNESS: This is a 59 y.o. female complaining of right upper abdominal pain on and off for 2 weeks.  Worse with fatty foods like butter or bacon. Able to eat and drink.  Denies nausea or vomiting.  Denies fever or chills.  Denies diarrhea or constipation.  Denies rectal bleeding or melena.  Not taking any NSAIDs or new medications.  No changes in her diet.  No other significant symptoms. No other complaints or medical concerns today.  HPI   Prior to Admission medications   Not on File    No Known Allergies  Patient Active Problem List   Diagnosis Date Noted  . Subclinical hyperthyroidism 07/05/2017  . Pure hypercholesterolemia 06/30/2017    Past Medical History:  Diagnosis Date  . Allergy     History reviewed. No pertinent surgical history.  Social History   Socioeconomic History  . Marital status: Married    Spouse name: Not on file  . Number of children: 5  . Years of education: Not on file  . Highest education level: Not on file  Occupational History  . Occupation: CNA  Tobacco Use  . Smoking status: Never Smoker  . Smokeless tobacco: Never Used  Substance and Sexual Activity  . Alcohol use: No  . Drug use: No  . Sexual activity: Yes  Other Topics Concern  . Not on file  Social History Narrative  . Not on file   Social Determinants of Health   Financial Resource Strain:   . Difficulty of Paying Living Expenses:   Food Insecurity:   . Worried About Programme researcher, broadcasting/film/video in the Last Year:   . Barista in the Last Year:   Transportation Needs:   . Freight forwarder (Medical):   Marland Kitchen Lack of Transportation (Non-Medical):   Physical Activity:   . Days of Exercise per Week:   . Minutes of Exercise per Session:   Stress:   . Feeling of Stress :   Social Connections:   .  Frequency of Communication with Friends and Family:   . Frequency of Social Gatherings with Friends and Family:   . Attends Religious Services:   . Active Member of Clubs or Organizations:   . Attends Banker Meetings:   Marland Kitchen Marital Status:   Intimate Partner Violence:   . Fear of Current or Ex-Partner:   . Emotionally Abused:   Marland Kitchen Physically Abused:   . Sexually Abused:     History reviewed. No pertinent family history.   Review of Systems  Constitutional: Negative.  Negative for chills and fever.  HENT: Negative.  Negative for congestion and sore throat.   Respiratory: Negative.  Negative for cough and shortness of breath.   Cardiovascular: Negative.  Negative for chest pain and palpitations.  Gastrointestinal: Positive for abdominal pain. Negative for blood in stool, constipation, diarrhea, melena, nausea and vomiting.  Genitourinary: Negative.  Negative for dysuria and hematuria.  Musculoskeletal: Negative.  Negative for back pain, myalgias and neck pain.  Skin: Negative.  Negative for rash.  Neurological: Negative.  Negative for dizziness and headaches.  All other systems reviewed and are negative.   Vitals:   08/02/19 1648  BP: 118/74  Pulse: 73  Resp: 16  Temp: 98 F (36.7 C)  SpO2: 99%    Physical Exam Vitals reviewed.  Constitutional:      Appearance: She is well-developed.  HENT:     Head: Normocephalic.  Eyes:     Extraocular Movements: Extraocular movements intact.     Conjunctiva/sclera: Conjunctivae normal.     Pupils: Pupils are equal, round, and reactive to light.  Cardiovascular:     Rate and Rhythm: Normal rate and regular rhythm.     Pulses: Normal pulses.     Heart sounds: Normal heart sounds.  Pulmonary:     Effort: Pulmonary effort is normal.     Breath sounds: Normal breath sounds.  Abdominal:     General: There is no distension.     Palpations: Abdomen is soft. There is no mass.     Tenderness: There is no abdominal  tenderness. There is no right CVA tenderness or left CVA tenderness.  Musculoskeletal:        General: Normal range of motion.     Cervical back: Normal range of motion and neck supple.  Skin:    General: Skin is warm and dry.  Neurological:     General: No focal deficit present.     Mental Status: She is alert and oriented to person, place, and time.  Psychiatric:        Mood and Affect: Mood normal.        Behavior: Behavior normal.    Results for orders placed or performed in visit on 08/02/19 (from the past 24 hour(s))  POCT urinalysis dipstick     Status: Abnormal   Collection Time: 08/02/19  5:16 PM  Result Value Ref Range   Color, UA yellow yellow   Clarity, UA clear clear   Glucose, UA negative negative mg/dL   Bilirubin, UA negative negative   Ketones, POC UA negative negative mg/dL   Spec Grav, UA 1.015 1.010 - 1.025   Blood, UA negative negative   pH, UA 5.5 5.0 - 8.0   Protein Ur, POC negative negative mg/dL   Urobilinogen, UA 0.2 0.2 or 1.0 E.U./dL   Nitrite, UA Negative Negative   Leukocytes, UA Trace (A) Negative     ASSESSMENT & PLAN: Teresa Morrison was seen today for abdominal pain.  Diagnoses and all orders for this visit:  Right upper quadrant abdominal pain Comments: Suspected biliary colic and gallstones Orders: -     Comprehensive metabolic panel -     Lipid panel -     Hemoglobin A1c -     Lipase -     US Abdomen Limited RUQ; Future  Screening for colon cancer -     Cologuard  Encounter for screening mammogram for malignant neoplasm of breast -     MM Digital Screening; Future  Encounter to establish care  Other orders -     POCT urinalysis dipstick    Patient Instructions       If you have lab work done today you will be contacted with your lab results within the next 2 weeks.  If you have not heard from [place] then please contact us. The fastest way to get your results is to register for My Chart.   IF you received an x-ray today,  you will receive an invoice from [facility]. Please contact [facility] at [phone] with questions or concerns regarding your invoice.   IF you received labwork today, you will receive an invoice from [facility]. Please contact [facility] at [phone] with questions or concerns regarding your invoice.  Our billing staff will not be able to assist you with questions regarding bills from these companies.  You will be contacted with the lab results as soon as they are available. The fastest way to get your results is to activate your My Chart account. Instructions are located on the last page of this paperwork. If you have not heard from [place] regarding the results in 2 weeks, please contact this office.     Biliary Colic, Adult  Biliary colic is severe pain caused by a problem with a small organ in the upper right part of your belly (gallbladder). The gallbladder stores a digestive fluid produced in the liver (bile) that helps the body break down fat. Bile and other digestive enzymes are carried from the liver to the small intestine through tube-like structures (bile ducts). The gallbladder and the bile ducts form the biliary tract. Sometimes hard deposits of digestive fluids form in the gallbladder (gallstones) and block the flow of bile from the gallbladder, causing biliary colic. This condition is also called a gallbladder attack. Gallstones can be as small as a grain of sand or as big as a golf ball. There could be just one gallstone in the gallbladder, or there could be many. What are the causes? Biliary colic is usually caused by gallstones. Less often, a tumor could block the flow of bile from the gallbladder and trigger biliary colic. What increases the risk? This condition is more likely to develop in:  Women.  People of Hispanic descent.  People with a family history of gallstones.  People who are obese.  People who suddenly or quickly lose weight.  People who  eat a high-calorie, low-fiber diet that is rich in refined carbs (carbohydrates), such as white bread and white rice.  People who have an intestinal disease that affects nutrient absorption, such as Crohn disease.  People who have a metabolic condition, such as metabolic syndrome or diabetes. What are the signs or symptoms? Severe pain in the upper right side of the belly is the main symptom of biliary colic. You may feel this pain below the chest but above the hip. This pain often occurs at night or after eating a very fatty meal. This pain may get worse for up to an hour and last as long as 12 hours. In most cases, the pain fades (subsides) within a couple hours. Other symptoms of this condition include:  Nausea and vomiting.  Pain under the right shoulder. How is this diagnosed? This condition is diagnosed based on your medical history, your symptoms, and a physical exam. You may have tests, including:  Blood tests to rule out infection or inflammation of the bile ducts, gallbladder, pancreas, or liver.  Imaging studies such as: ? Ultrasound. ? CT scan. ? MRI. In some cases, you may need to have an imaging study done using a small amount of radioactive material (nuclear medicine) to confirm the diagnosis. How is this treated? Treatment for this condition may include medicine to relieve your pain or nausea. If you have gallstones that are causing biliary colic, you may need surgery to remove the gallbladder (cholecystectomy). Gallstones can also be dissolved gradually with medicine. It may take months or years before the gallstones are completely gone. Follow these instructions at home:  Take over-the-counter and prescription medicines only as told by your health care provider.  Drink enough fluid to keep your urine clear or pale yellow.  Follow instructions from your health care provider about eating or drinking restrictions.  These may include avoiding: ? Fatty, greasy, and fried  foods. ? Any foods that make the pain worse. ? Overeating. ? Having a large meal after not eating for a while.  Keep all follow-up visits as told by your health care provider. This is important. How is this prevented? Steps to prevent this condition include:  Maintaining a healthy body weight.  Getting regular exercise.  Eating a healthy, high-fiber, low-fat diet.  Limiting how much sugar and refined carbs you eat, such as sweets, white flour, and white rice. Contact a health care provider if:  Your pain lasts more than 5 hours.  You vomit.  You have a fever and chills.  Your pain gets worse. Get help right away if:  Your skin or the whites of your eyes look yellow (jaundice).  Your have tea-colored urine and light-colored stools.  You are dizzy or you faint. Summary  Biliary colic is severe pain caused by a problem with a small organ in the upper right part of your belly (gallbladder).  Treatments for this condition include medicines that relieves your pain or nausea and medicines that slowly dissolves the gallstones.  If gallstones cause your biliary colic, the treatment is surgery to remove the gallbladder (cholecystectomy). This information is not intended to replace advice given to you by your health care provider. Make sure you discuss any questions you have with your health care provider. Document Revised: 02/21/2017 Document Reviewed: 09/25/2015 Elsevier Patient Education  2020 [employer]      Edwina Barth, MD [facility]

## 2019-08-02 NOTE — Patient Instructions (Addendum)
If you have lab work done today you will be contacted with your lab results within the next 2 weeks.  If you have not heard from [place] then please contact us. The fastest way to get your results is to register for My Chart.   IF you received an x-ray today, you will receive an invoice from [facility]. Please contact [facility] at [phone] with questions or concerns regarding your invoice.   IF you received labwork today, you will receive an invoice from [place]. Please contact [facility] at [phone] with questions or concerns regarding your invoice.   Our billing staff will not be able to assist you with questions regarding bills from these companies.  You will be contacted with the lab results as soon as they are available. The fastest way to get your results is to activate your My Chart account. Instructions are located on the last page of this paperwork. If you have not heard from [place] regarding the results in 2 weeks, please contact this office.     Biliary Colic, Adult  Biliary colic is severe pain caused by a problem with a small organ in the upper right part of your belly (gallbladder). The gallbladder stores a digestive fluid produced in the liver (bile) that helps the body break down fat. Bile and other digestive enzymes are carried from the liver to the small intestine through tube-like structures (bile ducts). The gallbladder and the bile ducts form the biliary tract. Sometimes hard deposits of digestive fluids form in the gallbladder (gallstones) and block the flow of bile from the gallbladder, causing biliary colic. This condition is also called a gallbladder attack. Gallstones can be as small as a grain of sand or as big as a golf ball. There could be just one gallstone in the gallbladder, or there could be many. What are the causes? Biliary colic is usually caused by gallstones. Less often, a tumor could block the flow of bile from the gallbladder and  trigger biliary colic. What increases the risk? This condition is more likely to develop in:  Women.  People of Hispanic descent.  People with a family history of gallstones.  People who are obese.  People who suddenly or quickly lose weight.  People who eat a high-calorie, low-fiber diet that is rich in refined carbs (carbohydrates), such as white bread and white rice.  People who have an intestinal disease that affects nutrient absorption, such as Crohn disease.  People who have a metabolic condition, such as metabolic syndrome or diabetes. What are the signs or symptoms? Severe pain in the upper right side of the belly is the main symptom of biliary colic. You may feel this pain below the chest but above the hip. This pain often occurs at night or after eating a very fatty meal. This pain may get worse for up to an hour and last as long as 12 hours. In most cases, the pain fades (subsides) within a couple hours. Other symptoms of this condition include:  Nausea and vomiting.  Pain under the right shoulder. How is this diagnosed? This condition is diagnosed based on your medical history, your symptoms, and a physical exam. You may have tests, including:  Blood tests to rule out infection or inflammation of the bile ducts, gallbladder, pancreas, or liver.  Imaging studies such as: ? Ultrasound. ? CT scan. ? MRI. In some cases, you may need to have an imaging study done using a small amount of radioactive material (  nuclear medicine) to confirm the diagnosis. How is this treated? Treatment for this condition may include medicine to relieve your pain or nausea. If you have gallstones that are causing biliary colic, you may need surgery to remove the gallbladder (cholecystectomy). Gallstones can also be dissolved gradually with medicine. It may take months or years before the gallstones are completely gone. Follow these instructions at home:  Take over-the-counter and  prescription medicines only as told by your health care provider.  Drink enough fluid to keep your urine clear or pale yellow.  Follow instructions from your health care provider about eating or drinking restrictions. These may include avoiding: ? Fatty, greasy, and fried foods. ? Any foods that make the pain worse. ? Overeating. ? Having a large meal after not eating for a while.  Keep all follow-up visits as told by your health care provider. This is important. How is this prevented? Steps to prevent this condition include:  Maintaining a healthy body weight.  Getting regular exercise.  Eating a healthy, high-fiber, low-fat diet.  Limiting how much sugar and refined carbs you eat, such as sweets, white flour, and white rice. Contact a health care provider if:  Your pain lasts more than 5 hours.  You vomit.  You have a fever and chills.  Your pain gets worse. Get help right away if:  Your skin or the whites of your eyes look yellow (jaundice).  Your have tea-colored urine and light-colored stools.  You are dizzy or you faint. Summary  Biliary colic is severe pain caused by a problem with a small organ in the upper right part of your belly (gallbladder).  Treatments for this condition include medicines that relieves your pain or nausea and medicines that slowly dissolves the gallstones.  If gallstones cause your biliary colic, the treatment is surgery to remove the gallbladder (cholecystectomy). This information is not intended to replace advice given to you by your health care provider. Make sure you discuss any questions you have with your health care provider. Document Revised: 02/21/2017 Document Reviewed: 09/25/2015 Elsevier Patient Education  2020 [employer].

## 2019-08-03 ENCOUNTER — Telehealth: Payer: Self-pay | Admitting: *Deleted

## 2019-08-03 LAB — COMPREHENSIVE METABOLIC PANEL
ALT: 12 IU/L (ref 0–32)
AST: 21 IU/L (ref 0–40)
Albumin/Globulin Ratio: 1.2 (ref 1.2–2.2)
Albumin: 4.1 g/dL (ref 3.8–4.9)
Alkaline Phosphatase: 71 IU/L (ref 39–117)
BUN/Creatinine Ratio: 15 (ref 9–23)
BUN: 11 mg/dL (ref 6–24)
Bilirubin Total: <0.2 mg/dL (ref 0.0–1.2)
CO2: 24 mmol/L (ref 20–29)
Calcium: 9.9 mg/dL (ref 8.7–10.2)
Chloride: 106 mmol/L (ref 96–106)
Creatinine, Ser: 0.73 mg/dL (ref 0.57–1.00)
GFR calc Af Amer: 104 mL/min/{1.73_m2} (ref >59)
GFR calc non Af Amer: 90 mL/min/{1.73_m2} (ref >59)
Globulin, Total: 3.3 g/dL (ref 1.5–4.5)
Glucose: 91 mg/dL (ref 65–99)
Potassium: 4.9 mmol/L (ref 3.5–5.2)
Sodium: 142 mmol/L (ref 134–144)
Total Protein: 7.4 g/dL (ref 6.0–8.5)

## 2019-08-03 LAB — HEMOGLOBIN A1C
Est. average glucose Bld gHb Est-mCnc: 108 mg/dL
Hgb A1c MFr Bld: 5.4 % (ref 4.8–5.6)

## 2019-08-03 LAB — LIPID PANEL
Chol/HDL Ratio: 2.9 ratio (ref 0.0–4.4)
Cholesterol, Total: 211 mg/dL — ABNORMAL HIGH (ref 100–199)
HDL: 73 mg/dL (ref >39)
LDL Chol Calc (NIH): 125 mg/dL — ABNORMAL HIGH (ref 0–99)
Triglycerides: 74 mg/dL (ref 0–149)
VLDL Cholesterol Cal: 13 mg/dL (ref 5–40)

## 2019-08-03 LAB — LIPASE: Lipase: 45 U/L (ref 14–72)

## 2019-08-03 NOTE — Telephone Encounter (Signed)
On Aug 02 2019, faxed requisition for Cologuard to [facility]. Confirmation page 5:46 pm.

## 2019-08-26 ENCOUNTER — Telehealth: Payer: Self-pay

## 2019-08-26 NOTE — Telephone Encounter (Signed)
Pt called requesting lab results, I did not see a note please advise

## 2019-08-26 NOTE — Telephone Encounter (Signed)
Pt. Called requesting lab results from 08/02/2019

## 2019-08-27 NOTE — Telephone Encounter (Signed)
Mild elevation of cholesterol.  Otherwise normal labs.  Recommend diet and exercise for cholesterol management.  No medications recommended at this time.  Send letter also with blood results.  Thanks.

## 2019-08-27 NOTE — Telephone Encounter (Signed)
Pt retuning office call for her lab results. Please advise.

## 2019-08-27 NOTE — Telephone Encounter (Signed)
I have called pt and relayed the lab results. Pt stated understanding.

## 2019-08-27 NOTE — Telephone Encounter (Signed)
Letter sent, was unable to reach pt LM for them to return for their lab results

## 2019-09-07 ENCOUNTER — Telehealth: Payer: Self-pay

## 2019-09-07 LAB — COLOGUARD: Cologuard: NEGATIVE

## 2019-09-07 NOTE — Progress Notes (Signed)
Call patient please.  Negative Cologuard.  Thanks.

## 2019-09-07 NOTE — Telephone Encounter (Signed)
-----   Message from [facility], MD sent at 09/07/2019 10:51 AM EDT ----- Call patient please. Negative Cologuard. Thanks.

## 2019-09-07 NOTE — Telephone Encounter (Signed)
LVM for pt to let her know that per Dr. Alvy Bimler, her cologuard results were Neg.

## 2019-11-08 ENCOUNTER — Ambulatory Visit: Payer: BC Managed Care – PPO | Admitting: Emergency Medicine

## 2019-11-08 ENCOUNTER — Other Ambulatory Visit: Payer: Self-pay

## 2019-11-08 ENCOUNTER — Encounter: Payer: Self-pay | Admitting: Emergency Medicine

## 2019-11-08 VITALS — BP 116/71 | HR 71 | Temp 98.0°F | Resp 16 | Ht 62.0 in | Wt 137.0 lb

## 2019-11-08 DIAGNOSIS — H579 Unspecified disorder of eye and adnexa: Secondary | ICD-10-CM

## 2019-11-08 DIAGNOSIS — H539 Unspecified visual disturbance: Secondary | ICD-10-CM | POA: Diagnosis not present

## 2019-11-08 NOTE — Progress Notes (Signed)
Teresa Morrison 59 y.o.   Chief Complaint  Patient presents with  . Eye Problem    BOTH - per when driving during the rain things are not clear    HISTORY OF PRESENT ILLNESS: This is a 59 y.o. female complaining of bilateral eye problems for at least 4 months probably longer. Sometimes it feels like "something inside my eyes".  Denies eye pain or headaches. Denies nausea or vomiting.  Denies loss of smell or taste.  Denies flulike symptoms. Denies balance problems. No significant past medical history.  No history of diabetes. Blood work done in May 2021 unremarkable. No other complaints or medical concerns today. Fully vaccinated against Covid.  HPI   Prior to Admission medications   Not on File    No Known Allergies  Patient Active Problem List   Diagnosis Date Noted  . Subclinical hyperthyroidism 07/05/2017  . Pure hypercholesterolemia 06/30/2017    Past Medical History:  Diagnosis Date  . Allergy     No past surgical history on file.  Social History   Socioeconomic History  . Marital status: Married    Spouse name: Not on file  . Number of children: 5  . Years of education: Not on file  . Highest education level: Not on file  Occupational History  . Occupation: CNA  Tobacco Use  . Smoking status: Never Smoker  . Smokeless tobacco: Never Used  Substance and Sexual Activity  . Alcohol use: No  . Drug use: No  . Sexual activity: Yes  Other Topics Concern  . Not on file  Social History Narrative  . Not on file   Social Determinants of Health   Financial Resource Strain:   . Difficulty of Paying Living Expenses:   Food Insecurity:   . Worried About Programme researcher, broadcasting/film/videounning Out of Food in the Last Year:   . Baristaan Out of Food in the Last Year:   Transportation Needs:   . Freight forwarderLack of Transportation (Medical):   Marland Kitchen. Lack of Transportation (Non-Medical):   Physical Activity:   . Days of Exercise per Week:   . Minutes of Exercise per Session:   Stress:   . Feeling of  Stress :   Social Connections:   . Frequency of Communication with Friends and Family:   . Frequency of Social Gatherings with Friends and Family:   . Attends Religious Services:   . Active Member of Clubs or Organizations:   . Attends BankerClub or Organization Meetings:   Marland Kitchen. Marital Status:   Intimate Partner Violence:   . Fear of Current or Ex-Partner:   . Emotionally Abused:   Marland Kitchen. Physically Abused:   . Sexually Abused:     No family history on file.   Review of Systems  Constitutional: Negative.  Negative for chills and fever.  HENT: Negative.  Negative for congestion, hearing loss and sore throat.   Eyes: Negative for photophobia, pain, discharge and redness.  Respiratory: Negative.  Negative for cough and shortness of breath.   Cardiovascular: Negative.  Negative for chest pain and palpitations.  Gastrointestinal: Negative for abdominal pain, diarrhea, nausea and vomiting.  Genitourinary: Negative.  Negative for dysuria and hematuria.  Musculoskeletal: Negative.  Negative for myalgias and neck pain.  Skin: Negative.  Negative for rash.  Neurological: Negative for dizziness and headaches.  All other systems reviewed and are negative.    Today's Vitals   11/08/19 1605  BP: 116/71  Pulse: 71  Resp: 16  Temp: 98 F (36.7 C)  TempSrc:  Temporal  SpO2: 97%  Weight: 137 lb (62.1 kg)  Height: 5\' 2"  (1.575 m)   Body mass index is 25.06 kg/m.  The 10-year ASCVD risk score DC Denman George., et al., 2013) is: 3.1%   Values used to calculate the score:     Age: 59 years     Sex: Female     Is Non-Hispanic African American: Yes     Diabetic: No     Tobacco smoker: No     Systolic Blood Pressure: 116 mmHg     Is BP treated: No     HDL Cholesterol: 73 mg/dL     Total Cholesterol: 211 mg/dL  Physical Exam Vitals reviewed.  Constitutional:      Appearance: Normal appearance.  HENT:     Head: Normocephalic.  Eyes:     Extraocular Movements: Extraocular movements intact.      Conjunctiva/sclera: Conjunctivae normal.     Pupils: Pupils are equal, round, and reactive to light.  Neck:     Vascular: No carotid bruit.  Cardiovascular:     Rate and Rhythm: Normal rate and regular rhythm.     Heart sounds: Normal heart sounds.  Pulmonary:     Effort: Pulmonary effort is normal.     Breath sounds: Normal breath sounds.  Musculoskeletal:        General: Normal range of motion.     Cervical back: Normal range of motion.  Lymphadenopathy:     Cervical: No cervical adenopathy.  Skin:    General: Skin is warm and dry.     Capillary Refill: Capillary refill takes less than 2 seconds.  Neurological:     General: No focal deficit present.     Mental Status: She is alert and oriented to person, place, and time.  Psychiatric:        Mood and Affect: Mood normal.        Behavior: Behavior normal.      ASSESSMENT & PLAN: [name] was seen today for eye problem.  Diagnoses and all orders for this visit:  Eye problems -     Ambulatory referral to Ophthalmology  Transient visual disturbance, bilateral -     Ambulatory referral to Ophthalmology    Patient Instructions       If you have lab work done today you will be contacted with your lab results within the next 2 weeks.  If you have not heard from [place] then please contact us. The fastest way to get your results is to register for My Chart.   IF you received an x-ray today, you will receive an invoice from [facility]. Please contact [facility] at [phone] with questions or concerns regarding your invoice.   IF you received labwork today, you will receive an invoice from [place]. Please contact [facility] at [phone] with questions or concerns regarding your invoice.   Our billing staff will not be able to assist you with questions regarding bills from these companies.  You will be contacted with the lab results as soon as they are available. The fastest way to get your results  is to activate your My Chart account. Instructions are located on the last page of this paperwork. If you have not heard from [phone] regarding the results in 2 weeks, please contact this office.     Visual Disturbances  A visual disturbance is any problem that interferes with your normal vision. This can affect one eye or both eyes. Some types of visual disturbances come  and go without treatment and do not cause a permanent problem. Other visual disturbances may be a sign of a medical emergency. Visual disturbances include:  Blurred vision.  Being unable to see certain colors.  Being sensitive to light.  Double vision.  Partial vision loss (visual field deficit).  Being unaware of objects on one side of the body (visual spatial inattention).  Rhythmic eye movements that you cannot control (nystagmus).  Short-term or long-term blindness.  Seeing: ? Floating spots or lines (floaters). ? Flashing or shimmering lights. ? Zigzagging lines or stars. ? The floor as tilted (visual midline shift). ? Things that are not really there (hallucinations). Causes of visual disturbances include:  Eye infection.  The thin membrane at the back of the eye separating from the eyeball (retinal detachment).  High blood pressure.  Migraine.  Glaucoma.  Ischemic stroke.  Cerebral aneurysm. It is important to get your eyes checked by a health care provider or eye specialist (ophthalmologist or optometrist) as soon as possible to determine the cause of your visual disturbance. Follow these instructions at home:  Take over-the-counter and prescription medicines only as told by your health care provider.  Do not use any products that contain nicotine or tobacco, such as cigarettes and e-cigarettes. If you need help quitting, ask your health care provider.  To lower your risk of the problems that can lead to visual disturbances: ? Eat a balanced diet that includes fruits and vegetables, whole  grains, lean meat, and low-fat dairy. ? Maintain a healthy weight. Work with your health care provider to lose weight if you need to. ? Exercise regularly. Ask your health care provider what activities are safe for you.  Do not drive if you have trouble seeing. Ask your health care provider for guidance about when it is and is not safe for you to drive.  Keep all follow-up visits as told by your health care provider. This is important. Contact a health care provider if:  Your visual disturbance changes or becomes worse. Get help right away if you:   Have new visual disturbances.  Suddenly see flashing lights or floaters.  Suddenly have a dark area in your field of vision, especially in the lower part. This can lead to a loss of central vision.  Lose vision in one or both eyes.  Have any symptoms of a stroke. "BE FAST" is an easy way to remember the main warning signs of a stroke: ? B - Balance. Signs are dizziness, sudden trouble walking, or loss of balance. ? E - Eyes. Signs are trouble seeing or a sudden change in vision. ? F - Face. Signs are sudden weakness or numbness of the face, or the face or eyelid drooping on one side. ? A - Arms. Signs are weakness or numbness in an arm. This happens suddenly and usually on one side of the body. ? S - Speech. Signs are sudden trouble speaking, slurred speech, or trouble understanding what people say. ? T - Time. Time to call emergency services. Write down what time symptoms started.  Have other signs of a stroke, such as: ? A sudden, severe headache with no known cause. ? Nausea or vomiting. ? Seizure. These symptoms may represent a serious problem that is an emergency. Do not wait to see if the symptoms will go away. Get medical help right away. Call your local emergency services (911 in the [place] Do not drive yourself to the hospital. Summary  A visual disturbance is any  problem that interferes with your normal vision.  Some visual  disturbances may be a sign of a medical emergency.  It is important to get your eyes checked by a health care provider or eye specialist to determine what kind of visual disturbance you have. This information is not intended to replace advice given to you by your health care provider. Make sure you discuss any questions you have with your health care provider. Document Revised: 06/30/2018 Document Reviewed: 04/01/2017 Elsevier Patient Education  2020 Elsevier Inc.      Edwina Barth, MD [facility]

## 2019-11-08 NOTE — Patient Instructions (Addendum)
If you have lab work done today you will be contacted with your lab results within the next 2 weeks.  If you have not heard from [place] then please contact us. The fastest way to get your results is to register for My Chart.   IF you received an x-ray today, you will receive an invoice from [facility]. Please contact [facility] at [phone] with questions or concerns regarding your invoice.   IF you received labwork today, you will receive an invoice from [place]. Please contact [facility] at [phone] with questions or concerns regarding your invoice.   Our billing staff will not be able to assist you with questions regarding bills from these companies.  You will be contacted with the lab results as soon as they are available. The fastest way to get your results is to activate your My Chart account. Instructions are located on the last page of this paperwork. If you have not heard from [place] regarding the results in 2 weeks, please contact this office.     Visual Disturbances  A visual disturbance is any problem that interferes with your normal vision. This can affect one eye or both eyes. Some types of visual disturbances come and go without treatment and do not cause a permanent problem. Other visual disturbances may be a sign of a medical emergency. Visual disturbances include:  Blurred vision.  Being unable to see certain colors.  Being sensitive to light.  Double vision.  Partial vision loss (visual field deficit).  Being unaware of objects on one side of the body (visual spatial inattention).  Rhythmic eye movements that you cannot control (nystagmus).  Short-term or long-term blindness.  Seeing: ? Floating spots or lines (floaters). ? Flashing or shimmering lights. ? Zigzagging lines or stars. ? The floor as tilted (visual midline shift). ? Things that are not really there (hallucinations). Causes of visual disturbances include:  Eye  infection.  The thin membrane at the back of the eye separating from the eyeball (retinal detachment).  High blood pressure.  Migraine.  Glaucoma.  Ischemic stroke.  Cerebral aneurysm. It is important to get your eyes checked by a health care provider or eye specialist (ophthalmologist or optometrist) as soon as possible to determine the cause of your visual disturbance. Follow these instructions at home:  Take over-the-counter and prescription medicines only as told by your health care provider.  Do not use any products that contain nicotine or tobacco, such as cigarettes and e-cigarettes. If you need help quitting, ask your health care provider.  To lower your risk of the problems that can lead to visual disturbances: ? Eat a balanced diet that includes fruits and vegetables, whole grains, lean meat, and low-fat dairy. ? Maintain a healthy weight. Work with your health care provider to lose weight if you need to. ? Exercise regularly. Ask your health care provider what activities are safe for you.  Do not drive if you have trouble seeing. Ask your health care provider for guidance about when it is and is not safe for you to drive.  Keep all follow-up visits as told by your health care provider. This is important. Contact a health care provider if:  Your visual disturbance changes or becomes worse. Get help right away if you:   Have new visual disturbances.  Suddenly see flashing lights or floaters.  Suddenly have a dark area in your field of vision, especially in the lower part. This can lead to a loss of  central vision.  Lose vision in one or both eyes.  Have any symptoms of a stroke. "BE FAST" is an easy way to remember the main warning signs of a stroke: ? B - Balance. Signs are dizziness, sudden trouble walking, or loss of balance. ? E - Eyes. Signs are trouble seeing or a sudden change in vision. ? F - Face. Signs are sudden weakness or numbness of the face, or the  face or eyelid drooping on one side. ? A - Arms. Signs are weakness or numbness in an arm. This happens suddenly and usually on one side of the body. ? S - Speech. Signs are sudden trouble speaking, slurred speech, or trouble understanding what people say. ? T - Time. Time to call emergency services. Write down what time symptoms started.  Have other signs of a stroke, such as: ? A sudden, severe headache with no known cause. ? Nausea or vomiting. ? Seizure. These symptoms may represent a serious problem that is an emergency. Do not wait to see if the symptoms will go away. Get medical help right away. Call your local emergency services (911 in the [place] Do not drive yourself to the hospital. Summary  A visual disturbance is any problem that interferes with your normal vision.  Some visual disturbances may be a sign of a medical emergency.  It is important to get your eyes checked by a health care provider or eye specialist to determine what kind of visual disturbance you have. This information is not intended to replace advice given to you by your health care provider. Make sure you discuss any questions you have with your health care provider. Document Revised: 06/30/2018 Document Reviewed: 04/01/2017 Elsevier Patient Education  2020 [employer].

## 2020-04-17 ENCOUNTER — Encounter: Payer: Self-pay | Admitting: Registered Nurse

## 2020-04-17 ENCOUNTER — Other Ambulatory Visit: Payer: Self-pay

## 2020-04-17 ENCOUNTER — Ambulatory Visit: Payer: BC Managed Care – PPO | Admitting: Registered Nurse

## 2020-04-17 VITALS — BP 126/72 | HR 70 | Temp 97.8°F | Resp 18 | Ht 62.0 in | Wt 138.2 lb

## 2020-04-17 DIAGNOSIS — R682 Dry mouth, unspecified: Secondary | ICD-10-CM | POA: Diagnosis not present

## 2020-04-17 DIAGNOSIS — R5383 Other fatigue: Secondary | ICD-10-CM

## 2020-04-17 DIAGNOSIS — R1013 Epigastric pain: Secondary | ICD-10-CM | POA: Diagnosis not present

## 2020-04-17 MED ORDER — PANTOPRAZOLE SODIUM 40 MG PO TBEC: 40.0000 mg | DELAYED_RELEASE_TABLET | Freq: Every day | ORAL | 3 refills | Status: AC

## 2020-04-17 NOTE — Patient Instructions (Signed)
° ° ° °  If you have lab work done today you will be contacted with your lab results within the next 2 weeks.  If you have not heard from us then please contact us. The fastest way to get your results is to register for My Chart. ° ° °IF you received an x-ray today, you will receive an invoice from [facility]. Please contact [facility] at [phone] with questions or concerns regarding your invoice.  ° °IF you received labwork today, you will receive an invoice from [facility]. Please contact [facility] at [phone] with questions or concerns regarding your invoice.  ° °Our billing staff will not be able to assist you with questions regarding bills from these companies. ° °You will be contacted with the lab results as soon as they are available. The fastest way to get your results is to activate your My Chart account. Instructions are located on the last page of this paperwork. If you have not heard from us regarding the results in 2 weeks, please contact this office. °  ° ° ° °

## 2020-04-18 ENCOUNTER — Encounter: Payer: Self-pay | Admitting: Radiology

## 2020-04-18 LAB — CBC
Hematocrit: 38.7 % (ref 34.0–46.6)
Hemoglobin: 13.5 g/dL (ref 11.1–15.9)
MCH: 30.3 pg (ref 26.6–33.0)
MCHC: 34.9 g/dL (ref 31.5–35.7)
MCV: 87 fL (ref 79–97)
Platelets: 370 10*3/uL (ref 150–450)
RBC: 4.46 x10E6/uL (ref 3.77–5.28)
RDW: 11.8 % (ref 11.7–15.4)
WBC: 6.9 10*3/uL (ref 3.4–10.8)

## 2020-04-18 LAB — BASIC METABOLIC PANEL
BUN/Creatinine Ratio: 16 (ref 12–28)
BUN: 13 mg/dL (ref 8–27)
CO2: 22 mmol/L (ref 20–29)
Calcium: 9.5 mg/dL (ref 8.7–10.3)
Chloride: 103 mmol/L (ref 96–106)
Creatinine, Ser: 0.82 mg/dL (ref 0.57–1.00)
GFR calc Af Amer: 90 mL/min/{1.73_m2} (ref >59)
GFR calc non Af Amer: 78 mL/min/{1.73_m2} (ref >59)
Glucose: 89 mg/dL (ref 65–99)
Potassium: 4 mmol/L (ref 3.5–5.2)
Sodium: 137 mmol/L (ref 134–144)

## 2020-04-18 LAB — HEMOGLOBIN A1C
Est. average glucose Bld gHb Est-mCnc: 111 mg/dL
Hgb A1c MFr Bld: 5.5 % (ref 4.8–5.6)

## 2020-04-18 LAB — TSH: TSH: 0.793 u[IU]/mL (ref 0.450–4.500)

## 2020-04-18 NOTE — Progress Notes (Signed)
Hello,  Letter, please  Ms. Hofmeister -  Your labs show no acute concerns. Please take the pantoprazole for two weeks and we can reassess at that time.  Thanks,  Jari Sportsman, NP

## 2020-06-16 ENCOUNTER — Ambulatory Visit: Payer: BC Managed Care – PPO | Admitting: Registered Nurse

## 2020-06-16 ENCOUNTER — Encounter: Payer: Self-pay | Admitting: Registered Nurse

## 2020-06-16 ENCOUNTER — Other Ambulatory Visit: Payer: Self-pay

## 2020-06-16 VITALS — BP 131/78 | HR 78 | Temp 98.0°F | Resp 18 | Ht 62.0 in | Wt 135.6 lb

## 2020-06-16 DIAGNOSIS — M545 Low back pain, unspecified: Secondary | ICD-10-CM | POA: Diagnosis not present

## 2020-06-16 MED ORDER — CYCLOBENZAPRINE HCL 5 MG PO TABS: 5.0000 mg | ORAL_TABLET | Freq: Two times a day (BID) | ORAL | 1 refills | Status: DC | PRN

## 2020-06-16 MED ORDER — DICLOFENAC SODIUM 75 MG PO TBEC: 75.0000 mg | DELAYED_RELEASE_TABLET | Freq: Two times a day (BID) | ORAL | 0 refills | Status: DC

## 2020-06-16 NOTE — Patient Instructions (Signed)
° ° ° °  If you have lab work done today you will be contacted with your lab results within the next 2 weeks.  If you have not heard from us then please contact us. The fastest way to get your results is to register for My Chart. ° ° °IF you received an x-ray today, you will receive an invoice from [facility]. Please contact [facility] at [phone] with questions or concerns regarding your invoice.  ° °IF you received labwork today, you will receive an invoice from [facility]. Please contact [facility] at [phone] with questions or concerns regarding your invoice.  ° °Our billing staff will not be able to assist you with questions regarding bills from these companies. ° °You will be contacted with the lab results as soon as they are available. The fastest way to get your results is to activate your My Chart account. Instructions are located on the last page of this paperwork. If you have not heard from us regarding the results in 2 weeks, please contact this office. °  ° ° ° °

## 2020-07-26 ENCOUNTER — Telehealth: Payer: BC Managed Care – PPO | Admitting: Registered Nurse

## 2020-08-07 ENCOUNTER — Other Ambulatory Visit: Payer: Self-pay

## 2020-08-07 ENCOUNTER — Ambulatory Visit: Payer: BC Managed Care – PPO | Admitting: Registered Nurse

## 2020-08-07 ENCOUNTER — Encounter: Payer: Self-pay | Admitting: Registered Nurse

## 2020-08-07 VITALS — BP 117/72 | HR 68 | Temp 98.2°F | Resp 18 | Ht 62.0 in | Wt 133.8 lb

## 2020-08-07 DIAGNOSIS — Z1231 Encounter for screening mammogram for malignant neoplasm of breast: Secondary | ICD-10-CM

## 2020-08-07 NOTE — Progress Notes (Signed)
Established Patient Office Visit  Subjective:  Patient ID: Teresa Morrison, female    DOB: January 30, 1961  Age: 60 y.o. MRN: [id number]  CC:  Chief Complaint  Patient presents with  . Referral    Patient states she is here for a referral for a mammogram she has no other concerns.    HPI Teresa Morrison presents for mammography  Was told by her insurance that she was due and would need to get a referral  Last study - unknown date but she reports all normal findings in past  No symptoms at this time  Does not perform routine self breast exams  No further concerns  Past Medical History:  Diagnosis Date  . Allergy     No past surgical history on file.  No family history on file.  Social History   Socioeconomic History  . Marital status: Married    Spouse name: Not on file  . Number of children: 5  . Years of education: Not on file  . Highest education level: Not on file  Occupational History  . Occupation: CNA  Tobacco Use  . Smoking status: Never Smoker  . Smokeless tobacco: Never Used  Substance and Sexual Activity  . Alcohol use: No  . Drug use: No  . Sexual activity: Yes  Other Topics Concern  . Not on file  Social History Narrative  . Not on file   Social Determinants of Health   Financial Resource Strain: Not on file  Food Insecurity: Not on file  Transportation Needs: Not on file  Physical Activity: Not on file  Stress: Not on file  Social Connections: Not on file  Intimate Partner Violence: Not on file    Outpatient Medications Prior to Visit  Medication Sig Dispense Refill  . cyclobenzaprine (FLEXERIL) 5 MG tablet Take 1 tablet (5 mg total) by mouth 2 (two) times daily as needed for muscle spasms. (Patient not taking: Reported on 08/07/2020) 30 tablet 1  . diclofenac (VOLTAREN) 75 MG EC tablet Take 1 tablet (75 mg total) by mouth 2 (two) times daily. (Patient not taking: Reported on 08/07/2020) 30 tablet 0  . pantoprazole (PROTONIX) 40 MG  tablet Take 1 tablet (40 mg total) by mouth daily. (Patient not taking: No sig reported) 30 tablet 3   No facility-administered medications prior to visit.    No Known Allergies  ROS Review of Systems  Constitutional: Negative.   HENT: Negative.   Eyes: Negative.   Respiratory: Negative.   Cardiovascular: Negative.   Gastrointestinal: Negative.   Genitourinary: Negative.   Musculoskeletal: Negative.   Skin: Negative.   Neurological: Negative.   Psychiatric/Behavioral: Negative.   All other systems reviewed and are negative.     Objective:    Physical Exam Vitals and nursing note reviewed.  Constitutional:      General: She is not in acute distress.    Appearance: Normal appearance. She is normal weight. She is not ill-appearing, toxic-appearing or diaphoretic.  Cardiovascular:     Rate and Rhythm: Normal rate and regular rhythm.     Heart sounds: Normal heart sounds. No murmur heard. No friction rub. No gallop.   Pulmonary:     Effort: Pulmonary effort is normal. No respiratory distress.     Breath sounds: Normal breath sounds. No stridor. No wheezing, rhonchi or rales.  Chest:     Chest wall: No tenderness.  Skin:    General: Skin is warm and dry.  Neurological:     General: No  focal deficit present.     Mental Status: She is alert and oriented to person, place, and time. Mental status is at baseline.  Psychiatric:        Mood and Affect: Mood normal.        Behavior: Behavior normal.        Thought Content: Thought content normal.        Judgment: Judgment normal.     BP 117/72   Pulse 68   Temp 98.2 F (36.8 C) (Temporal)   Resp 18   Ht 5\' 2"  (1.575 m)   Wt 133 lb 12.8 oz (60.7 kg)   SpO2 99%   BMI 24.47 kg/m  Wt Readings from Last 3 Encounters:  08/07/20 133 lb 12.8 oz (60.7 kg)  06/16/20 135 lb 9.6 oz (61.5 kg)  04/17/20 138 lb 3.2 oz (62.7 kg)     Health Maintenance Due  Topic Date Due  . COVID-19 Vaccine (1) Never done  . MAMMOGRAM   Never done    There are no preventive care reminders to display for this patient.  Lab Results  Component Value Date   TSH 0.793 04/17/2020   Lab Results  Component Value Date   WBC 6.9 04/17/2020   HGB 13.5 04/17/2020   HCT 38.7 04/17/2020   MCV 87 04/17/2020   PLT 370 04/17/2020   Lab Results  Component Value Date   NA 137 04/17/2020   K 4.0 04/17/2020   CO2 22 04/17/2020   GLUCOSE 89 04/17/2020   BUN 13 04/17/2020   CREATININE 0.82 04/17/2020   BILITOT <0.2 08/02/2019   ALKPHOS 71 08/02/2019   AST 21 08/02/2019   ALT 12 08/02/2019   PROT 7.4 08/02/2019   ALBUMIN 4.1 08/02/2019   CALCIUM 9.5 04/17/2020   Lab Results  Component Value Date   CHOL 211 (H) 08/02/2019   Lab Results  Component Value Date   HDL 73 08/02/2019   Lab Results  Component Value Date   LDLCALC 125 (H) 08/02/2019   Lab Results  Component Value Date   TRIG 74 08/02/2019   Lab Results  Component Value Date   CHOLHDL 2.9 08/02/2019   Lab Results  Component Value Date   HGBA1C 5.5 04/17/2020      Assessment & Plan:   Problem List Items Addressed This Visit   None   Visit Diagnoses    Screening mammogram for breast cancer    -  Primary   Relevant Orders   MM Digital Screening      No orders of the defined types were placed in this encounter.   Follow-up: No follow-ups on file.   PLAN  Orders sent [facility]  Will follow up as warranted  Patient encouraged to call clinic with any questions, comments, or concerns.  04/19/2020, NP

## 2020-08-07 NOTE — Patient Instructions (Addendum)
Ms Teresa Morrison to see you. I am glad your back is feeling better.  I have placed a referral for a mammogram to the [facility]. They should call you soon, but just in case, their phone number and address are as follows:   [address], [place], [place] [address]  [phone]   Thank you  Luan Pulling   If you have lab work done today you will be contacted with your lab results within the next 2 weeks.  If you have not heard from [place] then please contact us. The fastest way to get your results is to register for My Chart.   IF you received an x-ray today, you will receive an invoice from [facility]. Please contact [facility] at [phone] with questions or concerns regarding your invoice.   IF you received labwork today, you will receive an invoice from [facility]. Please contact [facility] at [phone] with questions or concerns regarding your invoice.   Our billing staff will not be able to assist you with questions regarding bills from these companies.  You will be contacted with the lab results as soon as they are available. The fastest way to get your results is to activate your My Chart account. Instructions are located on the last page of this paperwork. If you have not heard from [place] regarding the results in 2 weeks, please contact this office.

## 2020-08-24 ENCOUNTER — Other Ambulatory Visit: Payer: Self-pay | Admitting: Emergency Medicine

## 2020-08-24 DIAGNOSIS — Z1231 Encounter for screening mammogram for malignant neoplasm of breast: Secondary | ICD-10-CM

## 2020-10-19 ENCOUNTER — Ambulatory Visit
Admission: RE | Admit: 2020-10-19 | Discharge: 2020-10-19 | Disposition: A | Discharge status: Home / Self Care | Payer: BC Managed Care – PPO | Source: Ambulatory Visit | Attending: Emergency Medicine | Admitting: Emergency Medicine

## 2020-10-19 ENCOUNTER — Other Ambulatory Visit: Payer: Self-pay

## 2020-10-19 DIAGNOSIS — Z1231 Encounter for screening mammogram for malignant neoplasm of breast: Secondary | ICD-10-CM

## 2021-01-19 ENCOUNTER — Other Ambulatory Visit: Payer: Self-pay

## 2021-01-19 ENCOUNTER — Ambulatory Visit: Payer: BC Managed Care – PPO | Admitting: Registered Nurse

## 2021-01-19 ENCOUNTER — Encounter: Payer: Self-pay | Admitting: Registered Nurse

## 2021-01-19 VITALS — BP 111/69 | HR 72 | Temp 98.3°F | Resp 18 | Ht 62.0 in | Wt 135.6 lb

## 2021-01-19 DIAGNOSIS — L603 Nail dystrophy: Secondary | ICD-10-CM

## 2021-01-19 DIAGNOSIS — R42 Dizziness and giddiness: Secondary | ICD-10-CM | POA: Diagnosis not present

## 2021-01-19 DIAGNOSIS — R5383 Other fatigue: Secondary | ICD-10-CM

## 2021-01-19 LAB — CBC WITH DIFFERENTIAL/PLATELET
Basophils Absolute: 0.1 10*3/uL (ref 0.0–0.1)
Basophils Relative: 1 % (ref 0.0–3.0)
Eosinophils Absolute: 0.4 10*3/uL (ref 0.0–0.7)
Eosinophils Relative: 6.7 % — ABNORMAL HIGH (ref 0.0–5.0)
HCT: 37.8 % (ref 36.0–46.0)
Hemoglobin: 12.4 g/dL (ref 12.0–15.0)
Lymphocytes Relative: 32.5 % (ref 12.0–46.0)
Lymphs Abs: 2.1 10*3/uL (ref 0.7–4.0)
MCHC: 32.9 g/dL (ref 30.0–36.0)
MCV: 92.4 fl (ref 78.0–100.0)
Monocytes Absolute: 0.4 10*3/uL (ref 0.1–1.0)
Monocytes Relative: 6.5 % (ref 3.0–12.0)
Neutro Abs: 3.4 10*3/uL (ref 1.4–7.7)
Neutrophils Relative %: 53.3 % (ref 43.0–77.0)
Platelets: 317 10*3/uL (ref 150.0–400.0)
RBC: 4.09 Mil/uL (ref 3.87–5.11)
RDW: 12.8 % (ref 11.5–15.5)
WBC: 6.4 10*3/uL (ref 4.0–10.5)

## 2021-01-19 LAB — COMPREHENSIVE METABOLIC PANEL
ALT: 12 U/L (ref 0–35)
AST: 16 U/L (ref 0–37)
Albumin: 3.9 g/dL (ref 3.5–5.2)
Alkaline Phosphatase: 65 U/L (ref 39–117)
BUN: 11 mg/dL (ref 6–23)
CO2: 27 mEq/L (ref 19–32)
Calcium: 9.1 mg/dL (ref 8.4–10.5)
Chloride: 103 mEq/L (ref 96–112)
Creatinine, Ser: 0.8 mg/dL (ref 0.40–1.20)
GFR: 79.8 mL/min (ref >60.00)
Glucose, Bld: 78 mg/dL (ref 70–99)
Potassium: 4.5 mEq/L (ref 3.5–5.1)
Sodium: 140 mEq/L (ref 135–145)
Total Bilirubin: 0.3 mg/dL (ref 0.2–1.2)
Total Protein: 7.3 g/dL (ref 6.0–8.3)

## 2021-01-19 LAB — URINALYSIS
Bilirubin Urine: NEGATIVE
Hgb urine dipstick: NEGATIVE
Ketones, ur: NEGATIVE
Leukocytes,Ua: NEGATIVE
Nitrite: NEGATIVE
Specific Gravity, Urine: 1.02 (ref 1.000–1.030)
Total Protein, Urine: NEGATIVE
Urine Glucose: NEGATIVE
Urobilinogen, UA: 0.2 (ref 0.0–1.0)
pH: 5.5 (ref 5.0–8.0)

## 2021-01-19 LAB — T4, FREE: Free T4: 0.88 ng/dL (ref 0.60–1.60)

## 2021-01-19 LAB — VITAMIN D 25 HYDROXY (VIT D DEFICIENCY, FRACTURES): VITD: 28.09 ng/mL — ABNORMAL LOW (ref 30.00–100.00)

## 2021-01-19 LAB — B12 AND FOLATE PANEL
Folate: 16.3 ng/mL (ref >5.9)
Vitamin B-12: 851 pg/mL (ref 211–911)

## 2021-01-19 LAB — TSH: TSH: 0.33 u[IU]/mL — ABNORMAL LOW (ref 0.35–5.50)

## 2021-01-19 NOTE — Progress Notes (Signed)
Established Patient Office Visit  Subjective:  Patient ID: Teresa Morrison, female    DOB: 1960-11-23  Age: 60 y.o. MRN: [id number]  CC:  Chief Complaint  Patient presents with   Fatigue    Patient states she was having headaches but they have went away. Patient states she has been very fatigue for about a month.    HPI Teresa Morrison presents for headaches, fatigue, nail changes.  Headaches Occurring when she made the appt - have resolved since.  No neuro or cognitive changes at this time  Fatigue Ongoing x 1 mo. Hx of subclinical hyperthyroidism. Last tsh at 0.793 in Jan 2022. She does not have any known hx of anemia No GI changes. Weight has remained steady.  Sleep quality is good. Endorses good sleep hygiene. Does note some orthostatic changes - has to stand slowly or she will get dizzy Notes her legs are easily fatigued, but no claudication or dependent edema.  Nail changes Brittle and cracking Fingers and toes Ongoing for a few months No infection or thickening of nails noted No changes to hair that she is aware of.  Past Medical History:  Diagnosis Date   Allergy     History reviewed. No pertinent surgical history.  History reviewed. No pertinent family history.  Social History   Socioeconomic History   Marital status: Married    Spouse name: Not on file   Number of children: 5   Years of education: Not on file   Highest education level: Not on file  Occupational History   Occupation: CNA  Tobacco Use   Smoking status: Never   Smokeless tobacco: Never  Substance and Sexual Activity   Alcohol use: No   Drug use: No   Sexual activity: Yes  Other Topics Concern   Not on file  Social History Narrative   Not on file   Social Determinants of Health   Financial Resource Strain: Not on file  Food Insecurity: Not on file  Transportation Needs: Not on file  Physical Activity: Not on file  Stress: Not on file  Social Connections: Not on  file  Intimate Partner Violence: Not on file    Outpatient Medications Prior to Visit  Medication Sig Dispense Refill   cyclobenzaprine (FLEXERIL) 5 MG tablet Take 1 tablet (5 mg total) by mouth 2 (two) times daily as needed for muscle spasms. (Patient not taking: No sig reported) 30 tablet 1   diclofenac (VOLTAREN) 75 MG EC tablet Take 1 tablet (75 mg total) by mouth 2 (two) times daily. (Patient not taking: No sig reported) 30 tablet 0   pantoprazole (PROTONIX) 40 MG tablet Take 1 tablet (40 mg total) by mouth daily. (Patient not taking: No sig reported) 30 tablet 3   No facility-administered medications prior to visit.    Not on File  ROS Review of Systems  Constitutional:  Positive for fatigue. Negative for activity change, appetite change, chills, diaphoresis, fever and unexpected weight change.  HENT: Negative.    Eyes: Negative.   Respiratory: Negative.    Cardiovascular: Negative.   Gastrointestinal: Negative.   Endocrine: Negative.  Negative for cold intolerance and heat intolerance.  Genitourinary: Negative.   Musculoskeletal: Negative.   Skin: Negative.   Allergic/Immunologic: Negative.   Neurological: Negative.   Hematological: Negative.   Psychiatric/Behavioral: Negative.    All other systems reviewed and are negative.    Objective:    Physical Exam Vitals and nursing note reviewed.  Constitutional:  General: She is not in acute distress.    Appearance: Normal appearance. She is normal weight. She is not ill-appearing, toxic-appearing or diaphoretic.  Cardiovascular:     Rate and Rhythm: Normal rate and regular rhythm.     Heart sounds: Normal heart sounds. No murmur heard.   No friction rub. No gallop.  Pulmonary:     Effort: Pulmonary effort is normal. No respiratory distress.     Breath sounds: Normal breath sounds. No stridor. No wheezing, rhonchi or rales.  Chest:     Chest wall: No tenderness.  Skin:    General: Skin is warm and dry.   Neurological:     General: No focal deficit present.     Mental Status: She is alert and oriented to person, place, and time. Mental status is at baseline.  Psychiatric:        Mood and Affect: Mood normal.        Behavior: Behavior normal.        Thought Content: Thought content normal.        Judgment: Judgment normal.    BP 111/69   Pulse 72   Temp 98.3 F (36.8 C) (Temporal)   Resp 18   Ht 5\' 2"  (1.575 m)   Wt 135 lb 9.6 oz (61.5 kg)   SpO2 100%   BMI 24.80 kg/m  Wt Readings from Last 3 Encounters:  01/19/21 135 lb 9.6 oz (61.5 kg)  08/07/20 133 lb 12.8 oz (60.7 kg)  06/16/20 135 lb 9.6 oz (61.5 kg)     Health Maintenance Due  Topic Date Due   COVID-19 Vaccine (1) Never done   PAP SMEAR-Modifier  Never done   Zoster Vaccines- Shingrix (1 of 2) Never done    There are no preventive care reminders to display for this patient.  Lab Results  Component Value Date   TSH 0.793 04/17/2020   Lab Results  Component Value Date   WBC 6.9 04/17/2020   HGB 13.5 04/17/2020   HCT 38.7 04/17/2020   MCV 87 04/17/2020   PLT 370 04/17/2020   Lab Results  Component Value Date   NA 137 04/17/2020   K 4.0 04/17/2020   CO2 22 04/17/2020   GLUCOSE 89 04/17/2020   BUN 13 04/17/2020   CREATININE 0.82 04/17/2020   BILITOT <0.2 08/02/2019   ALKPHOS 71 08/02/2019   AST 21 08/02/2019   ALT 12 08/02/2019   PROT 7.4 08/02/2019   ALBUMIN 4.1 08/02/2019   CALCIUM 9.5 04/17/2020   Lab Results  Component Value Date   CHOL 211 (H) 08/02/2019   Lab Results  Component Value Date   HDL 73 08/02/2019   Lab Results  Component Value Date   LDLCALC 125 (H) 08/02/2019   Lab Results  Component Value Date   TRIG 74 08/02/2019   Lab Results  Component Value Date   CHOLHDL 2.9 08/02/2019   Lab Results  Component Value Date   HGBA1C 5.5 04/17/2020      Assessment & Plan:   Problem List Items Addressed This Visit   None Visit Diagnoses     Fatigue, unspecified type     -  Primary   Relevant Orders   CBC with Differential/Platelet   Comprehensive metabolic panel   TSH   T4, free   Urinalysis   Vitamin D (25 hydroxy)   B12 and Folate Panel   Brittle nails       Relevant Orders   TSH   T4,  free   Vitamin D (25 hydroxy)   B12 and Folate Panel   Orthostatic dizziness       Relevant Orders   EKG 12-Lead (Completed)       No orders of the defined types were placed in this encounter.   Follow-up: Return if symptoms worsen or fail to improve.   PLAN Unclear etiology though brittle nails may be suggestive of thyroid dysfunction. Collect TSH and T4.  Will check cbc, cmp, Vit D, urinalysis, B12 and folate to rule out other etiologies. No symptoms suggestive of cardiac dysfunction, exam today unremarkable, orthostatic vitals reassuring. EKG today compared to 06/30/2017. No acute changes. Notable for borderline short PR, otherwise RRR. No evidence of ischemic changes or hx of MI. Patient encouraged to call clinic with any questions, comments, or concerns.  Janeece Agee, NP

## 2021-01-19 NOTE — Patient Instructions (Addendum)
Ms. Ysabella Babiarz to see you! Sorry that you're having this fatigue. Let's check labs to see how these look.  I will be in touch this afternoon.  EKG looked reassuring.   I recommend staying well hydrated and eating 3 meals a day with snacks in between. Common causes of these kinds of symptoms are dehydration and low blood sugar.  I'll call soon  Rich     If you have lab work done today you will be contacted with your lab results within the next 2 weeks.  If you have not heard from [place] then please contact us. The fastest way to get your results is to register for My Chart.   IF you received an x-ray today, you will receive an invoice from [facility]. Please contact [facility] at [phone] with questions or concerns regarding your invoice.   IF you received labwork today, you will receive an invoice from [facility]. Please contact [facility] at [phone] with questions or concerns regarding your invoice.   Our billing staff will not be able to assist you with questions regarding bills from these companies.  You will be contacted with the lab results as soon as they are available. The fastest way to get your results is to activate your My Chart account. Instructions are located on the last page of this paperwork. If you have not heard from [place] regarding the results in 2 weeks, please contact this office.

## 2021-01-20 ENCOUNTER — Encounter: Payer: Self-pay | Admitting: Registered Nurse

## 2021-01-23 NOTE — Progress Notes (Signed)
Established Patient Office Visit  Subjective:  Patient ID: Teresa Morrison, female    DOB: 1961-01-25  Age: 60 y.o. MRN: [id number]  CC:  Chief Complaint  Patient presents with   Back Pain    Patient states Teresa Morrison here for back pain but Teresa Morrison states Teresa Morrison feel better but Teresa Morrison still came to the appointment. Teresa Morrison states it was hard to bend over or stretch out.    HPI Teresa Morrison presents for back pain  Acute onset. Lower back. Bilateral. No sciatica.  No trauma or acute injury that Teresa Morrison noted. Has been stretching and using OTC analgesics. Some improvement. No aspects are worsening. Denies saddle symptoms.  Past Medical History:  Diagnosis Date   Allergy     No past surgical history on file.  No family history on file.  Social History   Socioeconomic History   Marital status: Married    Spouse name: Not on file   Number of children: 5   Years of education: Not on file   Highest education level: Not on file  Occupational History   Occupation: CNA  Tobacco Use   Smoking status: Never   Smokeless tobacco: Never  Substance and Sexual Activity   Alcohol use: No   Drug use: No   Sexual activity: Yes  Other Topics Concern   Not on file  Social History Narrative   Not on file   Social Determinants of Health   Financial Resource Strain: Not on file  Food Insecurity: Not on file  Transportation Needs: Not on file  Physical Activity: Not on file  Stress: Not on file  Social Connections: Not on file  Intimate Partner Violence: Not on file    Outpatient Medications Prior to Visit  Medication Sig Dispense Refill   pantoprazole (PROTONIX) 40 MG tablet Take 1 tablet (40 mg total) by mouth daily. (Patient not taking: No sig reported) 30 tablet 3   No facility-administered medications prior to visit.    Not on File  ROS Review of Systems  Constitutional: Negative.   HENT: Negative.    Eyes: Negative.   Respiratory: Negative.    Cardiovascular: Negative.    Gastrointestinal: Negative.   Genitourinary: Negative.   Musculoskeletal:  Positive for back pain. Negative for arthralgias, gait problem, joint swelling, myalgias, neck pain and neck stiffness.  Skin: Negative.   Neurological: Negative.   Psychiatric/Behavioral: Negative.    All other systems reviewed and are negative.    Objective:    Physical Exam Vitals and nursing note reviewed.  Constitutional:      General: Teresa Morrison not in acute distress.    Appearance: Normal appearance. Teresa Morrison normal weight. Teresa Morrison not ill-appearing, toxic-appearing or diaphoretic.  Cardiovascular:     Rate and Rhythm: Normal rate and regular rhythm.     Heart sounds: Normal heart sounds. No murmur heard.   No friction rub. No gallop.  Pulmonary:     Effort: Pulmonary effort Morrison normal. No respiratory distress.     Breath sounds: Normal breath sounds. No stridor. No wheezing, rhonchi or rales.  Chest:     Chest wall: No tenderness.  Abdominal:     Tenderness: There Morrison no right CVA tenderness or left CVA tenderness.  Musculoskeletal:        General: Tenderness (paraspinal L3-S1) present. No swelling, deformity or signs of injury. Normal range of motion.  Skin:    General: Skin Morrison warm and dry.  Neurological:     General:  No focal deficit present.     Mental Status: Teresa Morrison alert and oriented to person, place, and time. Mental status Morrison at baseline.  Psychiatric:        Mood and Affect: Mood normal.        Behavior: Behavior normal.        Thought Content: Thought content normal.        Judgment: Judgment normal.    BP 131/78   Pulse 78   Temp 98 F (36.7 C) (Temporal)   Resp 18   Ht 5\' 2"  (1.575 m)   Wt 135 lb 9.6 oz (61.5 kg)   SpO2 98%   BMI 24.80 kg/m  Wt Readings from Last 3 Encounters:  01/19/21 135 lb 9.6 oz (61.5 kg)  08/07/20 133 lb 12.8 oz (60.7 kg)  06/16/20 135 lb 9.6 oz (61.5 kg)     Health Maintenance Due  Topic Date Due   COVID-19 Vaccine (1) Never done   PAP  SMEAR-Modifier  Never done   Zoster Vaccines- Shingrix (1 of 2) Never done    There are no preventive care reminders to display for this patient.  Lab Results  Component Value Date   TSH 0.33 (L) 01/19/2021   Lab Results  Component Value Date   WBC 6.4 01/19/2021   HGB 12.4 01/19/2021   HCT 37.8 01/19/2021   MCV 92.4 01/19/2021   PLT 317.0 01/19/2021   Lab Results  Component Value Date   NA 140 01/19/2021   K 4.5 01/19/2021   CO2 27 01/19/2021   GLUCOSE 78 01/19/2021   BUN 11 01/19/2021   CREATININE 0.80 01/19/2021   BILITOT 0.3 01/19/2021   ALKPHOS 65 01/19/2021   AST 16 01/19/2021   ALT 12 01/19/2021   PROT 7.3 01/19/2021   ALBUMIN 3.9 01/19/2021   CALCIUM 9.1 01/19/2021   GFR 79.80 01/19/2021   Lab Results  Component Value Date   CHOL 211 (H) 08/02/2019   Lab Results  Component Value Date   HDL 73 08/02/2019   Lab Results  Component Value Date   LDLCALC 125 (H) 08/02/2019   Lab Results  Component Value Date   TRIG 74 08/02/2019   Lab Results  Component Value Date   CHOLHDL 2.9 08/02/2019   Lab Results  Component Value Date   HGBA1C 5.5 04/17/2020      Assessment & Plan:   Problem List Items Addressed This Visit   None Visit Diagnoses     Acute bilateral low back pain without sciatica    -  Primary   Relevant Medications   diclofenac (VOLTAREN) 75 MG EC tablet   cyclobenzaprine (FLEXERIL) 5 MG tablet       Meds ordered this encounter  Medications   diclofenac (VOLTAREN) 75 MG EC tablet    Sig: Take 1 tablet (75 mg total) by mouth 2 (two) times daily.    Dispense:  30 tablet    Refill:  0    Order Specific Question:   Supervising Provider    Answer:   04/19/2020, JEFFREY R [[id number]]   cyclobenzaprine (FLEXERIL) 5 MG tablet    Sig: Take 1 tablet (5 mg total) by mouth 2 (two) times daily as needed for muscle spasms.    Dispense:  30 tablet    Refill:  1    Order Specific Question:   Supervising Provider    Answer:   Neva Seat, JEFFREY R  [[id number]]    Follow-up: No follow-ups on file.  PLAN Will give diclofenac and flexeril as above after review of risks, benefits, and alternatives. Reviewed home stretching. Can consider PT or imaging if pain worsens or fails to improve within 1-2 weeks. Patient encouraged to call clinic with any questions, comments, or concerns.  Janeece Agee, NP

## 2021-02-09 ENCOUNTER — Other Ambulatory Visit (HOSPITAL_BASED_OUTPATIENT_CLINIC_OR_DEPARTMENT_OTHER): Payer: Self-pay

## 2021-02-09 ENCOUNTER — Ambulatory Visit: Payer: BC Managed Care – PPO | Attending: Internal Medicine

## 2021-02-09 DIAGNOSIS — Z23 Encounter for immunization: Secondary | ICD-10-CM

## 2021-02-09 MED ORDER — MODERNA COVID-19 BIVAL BOOSTER 50 MCG/0.5ML IM SUSP
INTRAMUSCULAR | 0 refills | Status: DC
  Filled 2021-02-09: qty 0.5, 1d supply, fill #0

## 2021-02-09 NOTE — Progress Notes (Signed)
   Covid-19 Vaccination Clinic  Name:  Teresa Morrison    MRN: [id number] DOB: [birth year]  02/09/2021  Teresa Morrison was observed post Covid-19 immunization for 15 minutes without incident. She was provided with Vaccine Information Sheet and instruction to access the V-Safe system.   Teresa Morrison was instructed to call 911 with any severe reactions post vaccine: Difficulty breathing  Swelling of face and throat  A fast heartbeat  A bad rash all over body  Dizziness and weakness   Immunizations Administered     Name Date Dose VIS Date Route   Moderna Covid-19 vaccine Bivalent Booster 02/09/2021  9:33 AM 0.5 mL 11/04/2020 Intramuscular   Manufacturer: Gala Murdoch   Lot: [id number]   NDC: 93903-009-23

## 2021-05-12 NOTE — Progress Notes (Signed)
Established Patient Office Visit  Subjective:  Patient ID: Teresa Morrison, female    DOB: 04-29-1960  Age: 61 y.o. MRN: [id number]  CC:  Chief Complaint  Patient presents with   Fatigue    Patient states for the last two weeks she has been very fatigue , acid reflux and a very dry mouth.    HPI Teresa Morrison presents for fatigue  Ongoing for a few weeks Has been accompanied by epigastric pain/discomfort/bloating and dry mouth Unclear etiology, no changes to diet or lifestyle.  Denies nvd, shob, doe, chest pian, headache, loc, dizziness.   Otherwise no acute concerns.   Past Medical History:  Diagnosis Date   Allergy     No past surgical history on file.  No family history on file.  Social History   Socioeconomic History   Marital status: Married    Spouse name: Not on file   Number of children: 5   Years of education: Not on file   Highest education level: Not on file  Occupational History   Occupation: CNA  Tobacco Use   Smoking status: Never   Smokeless tobacco: Never  Substance and Sexual Activity   Alcohol use: No   Drug use: No   Sexual activity: Yes  Other Topics Concern   Not on file  Social History Narrative   Not on file   Social Determinants of Health   Financial Resource Strain: Not on file  Food Insecurity: Not on file  Transportation Needs: Not on file  Physical Activity: Not on file  Stress: Not on file  Social Connections: Not on file  Intimate Partner Violence: Not on file    No outpatient medications prior to visit.   No facility-administered medications prior to visit.    Not on File  ROS Review of Systems Per hpi     Objective:    Physical Exam Vitals and nursing note reviewed.  Constitutional:      General: She is not in acute distress.    Appearance: Normal appearance. She is normal weight. She is not ill-appearing, toxic-appearing or diaphoretic.  Cardiovascular:     Rate and Rhythm: Normal rate  and regular rhythm.     Heart sounds: Normal heart sounds. No murmur heard.   No friction rub. No gallop.  Pulmonary:     Effort: Pulmonary effort is normal. No respiratory distress.     Breath sounds: Normal breath sounds. No stridor. No wheezing, rhonchi or rales.  Chest:     Chest wall: No tenderness.  Skin:    General: Skin is warm and dry.  Neurological:     General: No focal deficit present.     Mental Status: She is alert and oriented to person, place, and time. Mental status is at baseline.  Psychiatric:        Mood and Affect: Mood normal.        Behavior: Behavior normal.        Thought Content: Thought content normal.        Judgment: Judgment normal.    BP 126/72    Pulse 70    Temp 97.8 F (36.6 C) (Temporal)    Resp 18    Ht 5\' 2"  (1.575 m)    Wt 138 lb 3.2 oz (62.7 kg)    SpO2 98%    BMI 25.28 kg/m  Wt Readings from Last 3 Encounters:  01/19/21 135 lb 9.6 oz (61.5 kg)  08/07/20 133 lb 12.8 oz (60.7  kg)  06/16/20 135 lb 9.6 oz (61.5 kg)     Health Maintenance Due  Topic Date Due   HIV Screening  Never done   PAP SMEAR-Modifier  Never done   Zoster Vaccines- Shingrix (1 of 2) Never done   COVID-19 Vaccine (1) 02/09/2021    There are no preventive care reminders to display for this patient.  Lab Results  Component Value Date   TSH 0.33 (L) 01/19/2021   Lab Results  Component Value Date   WBC 6.4 01/19/2021   HGB 12.4 01/19/2021   HCT 37.8 01/19/2021   MCV 92.4 01/19/2021   PLT 317.0 01/19/2021   Lab Results  Component Value Date   NA 140 01/19/2021   K 4.5 01/19/2021   CO2 27 01/19/2021   GLUCOSE 78 01/19/2021   BUN 11 01/19/2021   CREATININE 0.80 01/19/2021   BILITOT 0.3 01/19/2021   ALKPHOS 65 01/19/2021   AST 16 01/19/2021   ALT 12 01/19/2021   PROT 7.3 01/19/2021   ALBUMIN 3.9 01/19/2021   CALCIUM 9.1 01/19/2021   GFR 79.80 01/19/2021   Lab Results  Component Value Date   CHOL 211 (H) 08/02/2019   Lab Results  Component Value  Date   HDL 73 08/02/2019   Lab Results  Component Value Date   LDLCALC 125 (H) 08/02/2019   Lab Results  Component Value Date   TRIG 74 08/02/2019   Lab Results  Component Value Date   CHOLHDL 2.9 08/02/2019   Lab Results  Component Value Date   HGBA1C 5.5 04/17/2020      Assessment & Plan:   Problem List Items Addressed This Visit   None Visit Diagnoses     Abdominal pain, epigastric    -  Primary   Relevant Medications   pantoprazole (PROTONIX) 40 MG tablet   Other Relevant Orders   TSH (Completed)   Hemoglobin A1c (Completed)   CBC (Completed)   Basic Metabolic Panel (Completed)   Fatigue, unspecified type       Relevant Orders   TSH (Completed)   Hemoglobin A1c (Completed)   CBC (Completed)   Basic Metabolic Panel (Completed)   Dry mouth       Relevant Orders   TSH (Completed)   Hemoglobin A1c (Completed)   CBC (Completed)   Basic Metabolic Panel (Completed)       Meds ordered this encounter  Medications   pantoprazole (PROTONIX) 40 MG tablet    Sig: Take 1 tablet (40 mg total) by mouth daily.    Dispense:  30 tablet    Refill:  3    Order Specific Question:   Supervising Provider    Answer:   Carlota Raspberry, JEFFREY R [[id number]]    Follow-up: No follow-ups on file.   PLAN Suspect GERD contributing. Start pantoprazole 40mg  po qd Discussed regular exercise, healthy diet, and hydration to combat fatigue. Labs collected. Will follow up with the patient as warranted. Patient encouraged to call clinic with any questions, comments, or concerns.  Maximiano Coss, NP

## 2022-01-01 ENCOUNTER — Other Ambulatory Visit: Payer: Self-pay | Admitting: Emergency Medicine

## 2022-01-01 DIAGNOSIS — Z1231 Encounter for screening mammogram for malignant neoplasm of breast: Secondary | ICD-10-CM

## 2022-02-11 ENCOUNTER — Ambulatory Visit: Payer: BC Managed Care – PPO

## 2022-02-20 ENCOUNTER — Ambulatory Visit
Admission: RE | Admit: 2022-02-20 | Discharge: 2022-02-20 | Disposition: A | Discharge status: Home / Self Care | Payer: BC Managed Care – PPO | Source: Ambulatory Visit | Attending: Emergency Medicine | Admitting: Emergency Medicine

## 2022-02-20 DIAGNOSIS — Z1231 Encounter for screening mammogram for malignant neoplasm of breast: Secondary | ICD-10-CM

## 2022-04-30 ENCOUNTER — Ambulatory Visit: Payer: BC Managed Care – PPO | Admitting: Emergency Medicine

## 2022-04-30 ENCOUNTER — Encounter: Payer: Self-pay | Admitting: Emergency Medicine

## 2022-04-30 ENCOUNTER — Ambulatory Visit (INDEPENDENT_AMBULATORY_CARE_PROVIDER_SITE_OTHER): Payer: BC Managed Care – PPO

## 2022-04-30 VITALS — BP 110/72 | HR 70 | Temp 98.2°F | Ht 62.0 in | Wt 137.4 lb

## 2022-04-30 DIAGNOSIS — M545 Low back pain, unspecified: Secondary | ICD-10-CM | POA: Insufficient documentation

## 2022-04-30 DIAGNOSIS — R1011 Right upper quadrant pain: Secondary | ICD-10-CM | POA: Diagnosis not present

## 2022-04-30 DIAGNOSIS — H269 Unspecified cataract: Secondary | ICD-10-CM | POA: Diagnosis not present

## 2022-04-30 LAB — COMPREHENSIVE METABOLIC PANEL
ALT: 13 U/L (ref 0–35)
AST: 20 U/L (ref 0–37)
Albumin: 4.2 g/dL (ref 3.5–5.2)
Alkaline Phosphatase: 62 U/L (ref 39–117)
BUN: 18 mg/dL (ref 6–23)
CO2: 24 mEq/L (ref 19–32)
Calcium: 9.4 mg/dL (ref 8.4–10.5)
Chloride: 104 mEq/L (ref 96–112)
Creatinine, Ser: 0.73 mg/dL (ref 0.40–1.20)
GFR: 88.27 mL/min (ref >60.00)
Glucose, Bld: 77 mg/dL (ref 70–99)
Potassium: 3.8 mEq/L (ref 3.5–5.1)
Sodium: 139 mEq/L (ref 135–145)
Total Bilirubin: 0.3 mg/dL (ref 0.2–1.2)
Total Protein: 7.9 g/dL (ref 6.0–8.3)

## 2022-04-30 LAB — CBC WITH DIFFERENTIAL/PLATELET
Basophils Absolute: 0 10*3/uL (ref 0.0–0.1)
Basophils Relative: 0.5 % (ref 0.0–3.0)
Eosinophils Absolute: 0.5 10*3/uL (ref 0.0–0.7)
Eosinophils Relative: 6.3 % — ABNORMAL HIGH (ref 0.0–5.0)
HCT: 39.2 % (ref 36.0–46.0)
Hemoglobin: 13 g/dL (ref 12.0–15.0)
Lymphocytes Relative: 36 % (ref 12.0–46.0)
Lymphs Abs: 3.1 10*3/uL (ref 0.7–4.0)
MCHC: 33.2 g/dL (ref 30.0–36.0)
MCV: 91.3 fl (ref 78.0–100.0)
Monocytes Absolute: 0.5 10*3/uL (ref 0.1–1.0)
Monocytes Relative: 5.3 % (ref 3.0–12.0)
Neutro Abs: 4.5 10*3/uL (ref 1.4–7.7)
Neutrophils Relative %: 51.9 % (ref 43.0–77.0)
Platelets: 284 10*3/uL (ref 150.0–400.0)
RBC: 4.3 Mil/uL (ref 3.87–5.11)
RDW: 12.9 % (ref 11.5–15.5)
WBC: 8.6 10*3/uL (ref 4.0–10.5)

## 2022-04-30 LAB — LIPID PANEL
Cholesterol: 202 mg/dL — ABNORMAL HIGH (ref 0–200)
HDL: 69.9 mg/dL (ref >39.00)
LDL Cholesterol: 119 mg/dL — ABNORMAL HIGH (ref 0–99)
NonHDL: 132.18
Total CHOL/HDL Ratio: 3
Triglycerides: 68 mg/dL (ref 0.0–149.0)
VLDL: 13.6 mg/dL (ref 0.0–40.0)

## 2022-04-30 LAB — HEMOGLOBIN A1C: Hgb A1c MFr Bld: 5.5 % (ref 4.6–6.5)

## 2022-04-30 LAB — LIPASE: Lipase: 33 U/L (ref 11.0–59.0)

## 2022-04-30 NOTE — Progress Notes (Signed)
Teresa Morrison 62 y.o.   Chief Complaint  Patient presents with   Acute Visit    Back pain, heartburn constant, pain under her right rib     HISTORY OF PRESENT ILLNESS: This is a 62 y.o. female complaining of chronic intermittent pain to lumbar area and right upper abdomen for the last year. No associated symptoms. No other complaints or medical concerns today.  HPI   Prior to Admission medications   Medication Sig Start Date End Date Taking? Authorizing Provider  pantoprazole (PROTONIX) 40 MG tablet Take 1 tablet (40 mg total) by mouth daily. Patient not taking: Reported on 06/16/2020 04/17/20   Janeece Agee, NP    No Known Allergies  Patient Active Problem List   Diagnosis Date Noted   Subclinical hyperthyroidism 07/05/2017   Pure hypercholesterolemia 06/30/2017    Past Medical History:  Diagnosis Date   Allergy     No past surgical history on file.  Social History   Socioeconomic History   Marital status: Married    Spouse name: Not on file   Number of children: 5   Years of education: Not on file   Highest education level: Not on file  Occupational History   Occupation: CNA  Tobacco Use   Smoking status: Never   Smokeless tobacco: Never  Substance and Sexual Activity   Alcohol use: No   Drug use: No   Sexual activity: Yes  Other Topics Concern   Not on file  Social History Narrative   Not on file   Social Determinants of Health   Financial Resource Strain: Not on file  Food Insecurity: Not on file  Transportation Needs: Not on file  Physical Activity: Not on file  Stress: Not on file  Social Connections: Not on file  Intimate Partner Violence: Not on file    No family history on file.   Review of Systems  Constitutional: Negative.  Negative for chills, fever and weight loss.  HENT: Negative.  Negative for congestion and sore throat.   Respiratory: Negative.  Negative for cough and shortness of breath.   Cardiovascular: Negative.   Negative for chest pain and palpitations.  Gastrointestinal:  Positive for abdominal pain. Negative for nausea and vomiting.  Genitourinary: Negative.  Negative for dysuria and hematuria.  Musculoskeletal:  Positive for back pain.  Skin: Negative.  Negative for rash.  Neurological: Negative.  Negative for dizziness and headaches.   Today's Vitals   04/30/22 1549  BP: 110/72  Pulse: 70  Temp: 98.2 F (36.8 C)  TempSrc: Oral  SpO2: 97%  Weight: 137 lb 6 oz (62.3 kg)  Height: 5\' 2"  (1.575 m)   Body mass index is 25.13 kg/m.   Physical Exam Vitals reviewed.  Constitutional:      Appearance: Normal appearance.  HENT:     Head: Normocephalic.     Mouth/Throat:     Mouth: Mucous membranes are moist.     Pharynx: Oropharynx is clear.  Eyes:     Extraocular Movements: Extraocular movements intact.     Pupils: Pupils are equal, round, and reactive to light.  Cardiovascular:     Rate and Rhythm: Normal rate and regular rhythm.     Pulses: Normal pulses.     Heart sounds: Normal heart sounds.  Pulmonary:     Effort: Pulmonary effort is normal.     Breath sounds: Normal breath sounds.  Abdominal:     Palpations: Abdomen is soft.     Tenderness: There is  no abdominal tenderness.  Musculoskeletal:     Cervical back: No tenderness.  Lymphadenopathy:     Cervical: No cervical adenopathy.  Skin:    General: Skin is warm and dry.  Neurological:     General: No focal deficit present.     Mental Status: She is alert and oriented to person, place, and time.  Psychiatric:        Mood and Affect: Mood normal.        Behavior: Behavior normal.    DG Lumbar Spine 2-3 Views  Result Date: 04/30/2022 CLINICAL DATA:  Low back pain EXAM: LUMBAR SPINE - 2 VIEW COMPARISON:  None Available. FINDINGS: Lumbar vertebral body heights are well-maintained. Normal alignment. Mild multilevel degenerative disc disease, most severe at L5-S1. Moderate multilevel facet arthropathy. Soft tissues are  unremarkable. IMPRESSION: Mild multilevel degenerative disc disease, most severe at L5-S1. Electronically Signed   By: Yetta Glassman M.D.   On: 04/30/2022 16:23     ASSESSMENT & PLAN: A total of 47 minutes was spent with the patient and counseling/coordination of care regarding preparing for this visit, review of most recent office visit notes, review of all medications, differential diagnosis of lumbar pain and right upper quadrant abdominal pain, need for blood work today, review of lumbar spine x-ray report done today, pain management, education on nutrition, prognosis, documentation, and need for follow-up.  Problem List Items Addressed This Visit       Other   Right upper quadrant abdominal pain - Primary    Clinically stable.  No red flag signs or symptoms. Differential diagnosis including possibility of gallbladder disease discussed. Needs blood work today. Recommend gallbladder ultrasound. Will follow-up after that.      Relevant Orders   Urinalysis   Lipase   CBC with Differential/Platelet   Comprehensive metabolic panel   Hemoglobin A1c   Lipid panel   US Abdomen Limited RUQ (LIVER/GB)   Lumbar pain    Mechanical in nature and related to activities of daily living. X-ray shows degenerative chronic changes Pain management discussed.  Advised to take Tylenol and or Advil as needed for pain.  May need Ortho evaluation and or physical therapy in the future if not better. Clinically stable.  No red flag signs or symptoms.      Relevant Orders   Urinalysis   DG Lumbar Spine 2-3 Views (Completed)   Cataract of both eyes   Relevant Orders   Ambulatory referral to Ophthalmology   Patient Instructions  Acute Back Pain, Adult Acute back pain is sudden and usually short-lived. It is often caused by an injury to the muscles and tissues in the back. The injury may result from: A muscle, tendon, or ligament getting overstretched or torn. Ligaments are tissues that connect  bones to each other. Lifting something improperly can cause a back strain. Wear and tear (degeneration) of the spinal disks. Spinal disks are circular tissue that provide cushioning between the bones of the spine (vertebrae). Twisting motions, such as while playing sports or doing yard work. A hit to the back. Arthritis. You may have a physical exam, lab tests, and imaging tests to find the cause of your pain. Acute back pain usually goes away with rest and home care. Follow these instructions at home: Managing pain, stiffness, and swelling Take over-the-counter and prescription medicines only as told by your health care provider. Treatment may include medicines for pain and inflammation that are taken by mouth or applied to the skin, or muscle relaxants.  Your health care provider may recommend applying ice during the first 24-48 hours after your pain starts. To do this: Put ice in a plastic bag. Place a towel between your skin and the bag. Leave the ice on for 20 minutes, 2-3 times a day. Remove the ice if your skin turns bright red. This is very important. If you cannot feel pain, heat, or cold, you have a greater risk of damage to the area. If directed, apply heat to the affected area as often as told by your health care provider. Use the heat source that your health care provider recommends, such as a moist heat pack or a heating pad. Place a towel between your skin and the heat source. Leave the heat on for 20-30 minutes. Remove the heat if your skin turns bright red. This is especially important if you are unable to feel pain, heat, or cold. You have a greater risk of getting burned. Activity  Do not stay in bed. Staying in bed for more than 1-2 days can delay your recovery. Sit up and stand up straight. Avoid leaning forward when you sit or hunching over when you stand. If you work at a desk, sit close to it so you do not need to lean over. Keep your chin tucked in. Keep your neck drawn  back, and keep your elbows bent at a 90-degree angle (right angle). Sit high and close to the steering wheel when you drive. Add lower back (lumbar) support to your car seat, if needed. Take short walks on even surfaces as soon as you are able. Try to increase the length of time you walk each day. Do not sit, drive, or stand in one place for more than 30 minutes at a time. Sitting or standing for long periods of time can put stress on your back. Do not drive or use heavy machinery while taking prescription pain medicine. Use proper lifting techniques. When you bend and lift, use positions that put less stress on your back: [place] your knees. Keep the load close to your body. Avoid twisting. Exercise regularly as told by your health care provider. Exercising helps your back heal faster and helps prevent back injuries by keeping muscles strong and flexible. Work with a physical therapist to make a safe exercise program, as recommended by your health care provider. Do any exercises as told by your physical therapist. Lifestyle Maintain a healthy weight. Extra weight puts stress on your back and makes it difficult to have good posture. Avoid activities or situations that make you feel anxious or stressed. Stress and anxiety increase muscle tension and can make back pain worse. Learn ways to manage anxiety and stress, such as through exercise. General instructions Sleep on a firm mattress in a comfortable position. Try lying on your side with your knees slightly bent. If you lie on your back, put a pillow under your knees. Keep your head and neck in a straight line with your spine (neutral position) when using electronic equipment like smartphones or pads. To do this: Raise your smartphone or pad to look at it instead of bending your head or neck to look down. Put the smartphone or pad at the level of your face while looking at the screen. Follow your treatment plan as told by your health care provider.  This may include: Cognitive or behavioral therapy. Acupuncture or massage therapy. Meditation or yoga. Contact a health care provider if: You have pain that is not relieved with rest or medicine.  You have increasing pain going down into your legs or buttocks. Your pain does not improve after 2 weeks. You have pain at night. You lose weight without trying. You have a fever or chills. You develop nausea or vomiting. You develop abdominal pain. Get help right away if: You develop new bowel or bladder control problems. You have unusual weakness or numbness in your arms or legs. You feel faint. These symptoms may represent a serious problem that is an emergency. Do not wait to see if the symptoms will go away. Get medical help right away. Call your local emergency services (911 in the [place] Do not drive yourself to the hospital. Summary Acute back pain is sudden and usually short-lived. Use proper lifting techniques. When you bend and lift, use positions that put less stress on your back. Take over-the-counter and prescription medicines only as told by your health care provider, and apply heat or ice as told. This information is not intended to replace advice given to you by your health care provider. Make sure you discuss any questions you have with your health care provider. Document Revised: 06/02/2020 Document Reviewed: 06/02/2020 Elsevier Patient Education  [place], MD [facility]

## 2022-04-30 NOTE — Assessment & Plan Note (Signed)
Mechanical in nature and related to activities of daily living. X-ray shows degenerative chronic changes Pain management discussed.  Advised to take Tylenol and or Advil as needed for pain.  May need Ortho evaluation and or physical therapy in the future if not better. Clinically stable.  No red flag signs or symptoms.

## 2022-04-30 NOTE — Patient Instructions (Signed)
Acute Back Pain, Adult Acute back pain is sudden and usually short-lived. It is often caused by an injury to the muscles and tissues in the back. The injury may result from: A muscle, tendon, or ligament getting overstretched or torn. Ligaments are tissues that connect bones to each other. Lifting something improperly can cause a back strain. Wear and tear (degeneration) of the spinal disks. Spinal disks are circular tissue that provide cushioning between the bones of the spine (vertebrae). Twisting motions, such as while playing sports or doing yard work. A hit to the back. Arthritis. You may have a physical exam, lab tests, and imaging tests to find the cause of your pain. Acute back pain usually goes away with rest and home care. Follow these instructions at home: Managing pain, stiffness, and swelling Take over-the-counter and prescription medicines only as told by your health care provider. Treatment may include medicines for pain and inflammation that are taken by mouth or applied to the skin, or muscle relaxants. Your health care provider may recommend applying ice during the first 24-48 hours after your pain starts. To do this: Put ice in a plastic bag. Place a towel between your skin and the bag. Leave the ice on for 20 minutes, 2-3 times a day. Remove the ice if your skin turns bright red. This is very important. If you cannot feel pain, heat, or cold, you have a greater risk of damage to the area. If directed, apply heat to the affected area as often as told by your health care provider. Use the heat source that your health care provider recommends, such as a moist heat pack or a heating pad. Place a towel between your skin and the heat source. Leave the heat on for 20-30 minutes. Remove the heat if your skin turns bright red. This is especially important if you are unable to feel pain, heat, or cold. You have a greater risk of getting burned. Activity  Do not stay in bed. Staying in  bed for more than 1-2 days can delay your recovery. Sit up and stand up straight. Avoid leaning forward when you sit or hunching over when you stand. If you work at a desk, sit close to it so you do not need to lean over. Keep your chin tucked in. Keep your neck drawn back, and keep your elbows bent at a 90-degree angle (right angle). Sit high and close to the steering wheel when you drive. Add lower back (lumbar) support to your car seat, if needed. Take short walks on even surfaces as soon as you are able. Try to increase the length of time you walk each day. Do not sit, drive, or stand in one place for more than 30 minutes at a time. Sitting or standing for long periods of time can put stress on your back. Do not drive or use heavy machinery while taking prescription pain medicine. Use proper lifting techniques. When you bend and lift, use positions that put less stress on your back: Bend your knees. Keep the load close to your body. Avoid twisting. Exercise regularly as told by your health care provider. Exercising helps your back heal faster and helps prevent back injuries by keeping muscles strong and flexible. Work with a physical therapist to make a safe exercise program, as recommended by your health care provider. Do any exercises as told by your physical therapist. Lifestyle Maintain a healthy weight. Extra weight puts stress on your back and makes it difficult to have good   posture. Avoid activities or situations that make you feel anxious or stressed. Stress and anxiety increase muscle tension and can make back pain worse. Learn ways to manage anxiety and stress, such as through exercise. General instructions Sleep on a firm mattress in a comfortable position. Try lying on your side with your knees slightly bent. If you lie on your back, put a pillow under your knees. Keep your head and neck in a straight line with your spine (neutral position) when using electronic equipment like  smartphones or pads. To do this: Raise your smartphone or pad to look at it instead of bending your head or neck to look down. Put the smartphone or pad at the level of your face while looking at the screen. Follow your treatment plan as told by your health care provider. This may include: Cognitive or behavioral therapy. Acupuncture or massage therapy. Meditation or yoga. Contact a health care provider if: You have pain that is not relieved with rest or medicine. You have increasing pain going down into your legs or buttocks. Your pain does not improve after 2 weeks. You have pain at night. You lose weight without trying. You have a fever or chills. You develop nausea or vomiting. You develop abdominal pain. Get help right away if: You develop new bowel or bladder control problems. You have unusual weakness or numbness in your arms or legs. You feel faint. These symptoms may represent a serious problem that is an emergency. Do not wait to see if the symptoms will go away. Get medical help right away. Call your local emergency services (911 in the [place] Do not drive yourself to the hospital. Summary Acute back pain is sudden and usually short-lived. Use proper lifting techniques. When you bend and lift, use positions that put less stress on your back. Take over-the-counter and prescription medicines only as told by your health care provider, and apply heat or ice as told. This information is not intended to replace advice given to you by your health care provider. Make sure you discuss any questions you have with your health care provider. Document Revised: 06/02/2020 Document Reviewed: 06/02/2020 Elsevier Patient Education  2023 [employer]  

## 2022-04-30 NOTE — Assessment & Plan Note (Signed)
Clinically stable.  No red flag signs or symptoms. Differential diagnosis including possibility of gallbladder disease discussed. Needs blood work today. Recommend gallbladder ultrasound. Will follow-up after that.

## 2022-05-01 LAB — URINALYSIS, ROUTINE W REFLEX MICROSCOPIC
Bilirubin Urine: NEGATIVE
Hgb urine dipstick: NEGATIVE
Ketones, ur: NEGATIVE
Nitrite: NEGATIVE
RBC / HPF: NONE SEEN (ref 0–?)
Specific Gravity, Urine: 1.01 (ref 1.000–1.030)
Total Protein, Urine: NEGATIVE
Urine Glucose: NEGATIVE
Urobilinogen, UA: 0.2 (ref 0.0–1.0)
pH: 6.5 (ref 5.0–8.0)

## 2022-05-13 ENCOUNTER — Ambulatory Visit
Admission: RE | Admit: 2022-05-13 | Discharge: 2022-05-13 | Disposition: A | Discharge status: Home / Self Care | Payer: BC Managed Care – PPO | Source: Ambulatory Visit | Attending: Emergency Medicine | Admitting: Emergency Medicine

## 2022-05-13 DIAGNOSIS — R1011 Right upper quadrant pain: Secondary | ICD-10-CM

## 2022-05-14 ENCOUNTER — Other Ambulatory Visit: Payer: Self-pay | Admitting: Emergency Medicine

## 2022-05-14 DIAGNOSIS — K8080 Other cholelithiasis without obstruction: Secondary | ICD-10-CM

## 2022-05-14 DIAGNOSIS — K8020 Calculus of gallbladder without cholecystitis without obstruction: Secondary | ICD-10-CM | POA: Insufficient documentation

## 2022-05-28 ENCOUNTER — Ambulatory Visit: Payer: BC Managed Care – PPO | Admitting: Emergency Medicine

## 2022-05-28 ENCOUNTER — Encounter: Payer: Self-pay | Admitting: Emergency Medicine

## 2022-05-28 VITALS — BP 110/70 | HR 72 | Temp 98.1°F | Ht 62.0 in | Wt 133.4 lb

## 2022-05-28 DIAGNOSIS — K8020 Calculus of gallbladder without cholecystitis without obstruction: Secondary | ICD-10-CM

## 2022-05-28 NOTE — Assessment & Plan Note (Signed)
Presently asymptomatic. Uncomplicated Multiple gallstones on ultrasound Recommend general surgeon evaluation Referral was placed a couple weeks ago. Office information given to patient.

## 2022-05-28 NOTE — Patient Instructions (Signed)
Cholelithiasis  Cholelithiasis happens when gallstones form in the gallbladder. The gallbladder stores bile. Bile is a fluid that helps digest fats. Bile can harden and form into gallstones. If they cause a blockage, they can cause pain (gallbladder attack). What are the causes? This condition may be caused by: Too much bilirubin in the bile. This happens if you have sickle cell anemia. Too much of a fat-like substance (cholesterol) in your bile. Not enough bile salts in your bile. These salts help the body absorb and digest fats. The gallbladder not emptying fully or often enough. This is common in pregnant women. What increases the risk? The following factors may make you more likely to develop this condition: Being older than age 70. Eating a lot of fried foods, fat, and refined carbs (refined carbohydrates). Being female. Being pregnant many times. Using medicines with female hormones in them for a long time. Losing weight fast. Having gallstones in your family. Having health problems, such as diabetes, obesity, Crohn's disease, or liver disease. What are the signs or symptoms? Often, there may be gallstones but no symptoms. These gallstones are called silent gallstones. If a gallstone causes a blockage, you may get sudden pain. The pain: Can be in the upper right part of your belly (abdomen). Normally comes at night or after you eat. Can last an hour or more. Can spread to your right shoulder, back, or chest. Can feel like discomfort, burning, or fullness in the upper part of your belly (indigestion). If the blockage lasts more than a few hours, you can get an infection or swelling. You may: Vomit or feel like you may vomit (nauseous). Feel bloated. Have belly pain for 5 hours or more. Feel tender in your belly, often in the upper right part and under your ribs. Have a fever or chills. Have skin or the white parts of your eyes turn yellow (jaundice). Have dark pee (urine) or  pale poop (stool). How is this treated? Treatment for this condition depends on how bad you feel. If you have symptoms, you may need: Home care, if symptoms are not very bad. Do not eat for 12-24 hours. Drink only water and clear liquids. After 1 or 2 days, start to eat simple or clear foods. Try broth and crackers. You may need medicines for pain or stomach upset or both. If you have an infection, you will need antibiotics. A hospital stay, if you have very bad pain or a very bad infection. Surgery to remove your gallbladder. You may need this if: Gallstones keep coming back. You have very bad symptoms. Medicines to break up gallstones. Medicines may be used for 6-12 months. A procedure to find and take out gallstones or to break up gallstones. Follow these instructions at home: Medicines Take over-the-counter and prescription medicines only as told by your doctor. If you were prescribed antibiotics, take them as told by your doctor. Do not stop taking them even if you start to feel better. Ask your doctor if you should avoid driving or using machines while you are taking your medicine. Eating and drinking Drink enough fluid to keep your pee pale yellow. Drink water or clear fluids. This is important when you have pain. Eat healthy foods. Choose: Fewer fatty foods, such as fried foods. Fewer refined carbs. Avoid breads and grains that are highly processed, such as white bread and white rice. Choose whole grains, such as whole-wheat bread and brown rice. More fiber. Almonds, fresh fruit, and beans are healthy sources. General  instructions Keep a healthy weight. Keep all follow-up visits. You may need to see a specialist or a Psychologist, sport and exercise. Where to find more information [employer] of Diabetes and Digestive and Kidney Diseases: [email or web address] Contact a doctor if: You have sudden pain in the upper right part of your belly. Pain might spread to your right shoulder, back, or chest. Your  pain lasts more than 2 hours. You have been diagnosed with gallstones that have no symptoms and you get: Belly pain. Discomfort, burning, or fullness in the upper part of your abdomen. You keep feeling like you may vomit. You have dark pee or pale poop. Get help right away if: You have pain in your abdomen, that: Lasts more than 5 hours. Keeps getting worse. You have a fever or chills. You can't stop vomiting. Your skin or the white parts of your eyes turn yellow. This information is not intended to replace advice given to you by your health care provider. Make sure you discuss any questions you have with your health care provider. Document Revised: 12/24/2021 Document Reviewed: 12/24/2021 Elsevier Patient Education  [place].

## 2022-05-28 NOTE — Progress Notes (Signed)
Teresa Morrison 62 y.o.   Chief Complaint  Patient presents with   Follow-up    F/u appt [place] results    HISTORY OF PRESENT ILLNESS: This is a 62 y.o. female here for follow-up of visit on 04/30/2022 and gallbladder ultrasound done on 05/13/2022 which showed gallstones. Patient asymptomatic at present time.  She was referred to general surgeon for evaluation. No other complaints or medical concerns today.  HPI   Prior to Admission medications   Medication Sig Start Date End Date Taking? Authorizing Provider  pantoprazole (PROTONIX) 40 MG tablet Take 1 tablet (40 mg total) by mouth daily. 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  Social History   Socioeconomic History   Marital status: Married    Spouse name: Not on file   Number of children: 5   Years of education: Not on file   Highest education level: Not on file  Occupational History   Occupation: CNA  Tobacco Use   Smoking status: Never   Smokeless tobacco: Never  Substance and Sexual Activity   Alcohol use: No   Drug use: No   Sexual activity: Yes  Other Topics Concern   Not on file  Social History Narrative   Not on file   Social Determinants of Health   Financial Resource Strain: Not on file  Food Insecurity: Not on file  Transportation Needs: Not on file  Physical Activity: Not on file  Stress: Not on file  Social Connections: Not on file  Intimate Partner Violence: Not on file    No family history on file.   Review of Systems  Constitutional: Negative.  Negative for chills and  fever.  HENT: Negative.  Negative for congestion and sore throat.   Respiratory: Negative.  Negative for cough and shortness of breath.   Cardiovascular: Negative.  Negative for chest pain and palpitations.  Gastrointestinal:  Negative for abdominal pain, diarrhea, nausea and vomiting.  Skin: Negative.  Negative for rash.  Neurological: Negative.  Negative for dizziness and headaches.  All other systems reviewed and are negative.  Today's Vitals   05/28/22 1559  BP: 110/70  Pulse: 72  Temp: 98.1 F (36.7 C)  TempSrc: Oral  SpO2: 99%  Weight: 133 lb 6 oz (60.5 kg)  Height: '5\' 2"'$  (1.575 m)   Body mass index is 24.39 kg/m.   Physical Exam Vitals reviewed.  Constitutional:      Appearance: Normal appearance.  HENT:     Head: Normocephalic.  Eyes:     Extraocular Movements: Extraocular movements intact.  Cardiovascular:     Rate and Rhythm: Normal rate.  Pulmonary:     Effort: Pulmonary effort is normal.  Abdominal:     Palpations: Abdomen is soft.     Tenderness: There is no abdominal tenderness.  Skin:    General: Skin is warm and dry.     Capillary Refill: Capillary refill takes less than 2 seconds.  Neurological:     Mental Status: She is alert and oriented to person, place, and time.  Psychiatric:  Mood and Affect: Mood normal.        Behavior: Behavior normal.      ASSESSMENT & PLAN: A total of 35 minutes was spent with the patient and counseling/coordination of care regarding preparing for this visit, review of most recent office visit notes, review of most recent blood work results, review of most recent gallbladder ultrasound report, diagnosis of gallstones and need for general surgery evaluation, education on nutrition, prognosis, documentation and need for follow-up.  Problem List Items Addressed This Visit       Digestive   Cholelithiasis - Primary    Presently asymptomatic. Uncomplicated Multiple gallstones on ultrasound Recommend general  surgeon evaluation Referral was placed a couple weeks ago. Office information given to patient.      Patient Instructions  Cholelithiasis  Cholelithiasis happens when gallstones form in the gallbladder. The gallbladder stores bile. Bile is a fluid that helps digest fats. Bile can harden and form into gallstones. If they cause a blockage, they can cause pain (gallbladder attack). What are the causes? This condition may be caused by: Too much bilirubin in the bile. This happens if you have sickle cell anemia. Too much of a fat-like substance (cholesterol) in your bile. Not enough bile salts in your bile. These salts help the body absorb and digest fats. The gallbladder not emptying fully or often enough. This is common in pregnant women. What increases the risk? The following factors may make you more likely to develop this condition: Being older than age 63. Eating a lot of fried foods, fat, and refined carbs (refined carbohydrates). Being female. Being pregnant many times. Using medicines with female hormones in them for a long time. Losing weight fast. Having gallstones in your family. Having health problems, such as diabetes, obesity, Crohn's disease, or liver disease. What are the signs or symptoms? Often, there may be gallstones but no symptoms. These gallstones are called silent gallstones. If a gallstone causes a blockage, you may get sudden pain. The pain: Can be in the upper right part of your belly (abdomen). Normally comes at night or after you eat. Can last an hour or more. Can spread to your right shoulder, back, or chest. Can feel like discomfort, burning, or fullness in the upper part of your belly (indigestion). If the blockage lasts more than a few hours, you can get an infection or swelling. You may: Vomit or feel like you may vomit (nauseous). Feel bloated. Have belly pain for 5 hours or more. Feel tender in your belly, often in the upper right part and under  your ribs. Have a fever or chills. Have skin or the white parts of your eyes turn yellow (jaundice). Have dark pee (urine) or pale poop (stool). How is this treated? Treatment for this condition depends on how bad you feel. If you have symptoms, you may need: Home care, if symptoms are not very bad. Do not eat for 12-24 hours. Drink only water and clear liquids. After 1 or 2 days, start to eat simple or clear foods. Try broth and crackers. You may need medicines for pain or stomach upset or both. If you have an infection, you will need antibiotics. A hospital stay, if you have very bad pain or a very bad infection. Surgery to remove your gallbladder. You may need this if: Gallstones keep coming back. You have very bad symptoms. Medicines to break up gallstones. Medicines may be used for 6-12 months. A procedure to find and take out gallstones or to  break up gallstones. Follow these instructions at home: Medicines Take over-the-counter and prescription medicines only as told by your doctor. If you were prescribed antibiotics, take them as told by your doctor. Do not stop taking them even if you start to feel better. Ask your doctor if you should avoid driving or using machines while you are taking your medicine. Eating and drinking Drink enough fluid to keep your pee pale yellow. Drink water or clear fluids. This is important when you have pain. Eat healthy foods. Choose: Fewer fatty foods, such as fried foods. Fewer refined carbs. Avoid breads and grains that are highly processed, such as white bread and white rice. Choose whole grains, such as whole-wheat bread and brown rice. More fiber. Almonds, fresh fruit, and beans are healthy sources. General instructions Keep a healthy weight. Keep all follow-up visits. You may need to see a specialist or a Psychologist, sport and exercise. Where to find more information [employer] of Diabetes and Digestive and Kidney Diseases: [email or web address] Contact a doctor  if: You have sudden pain in the upper right part of your belly. Pain might spread to your right shoulder, back, or chest. Your pain lasts more than 2 hours. You have been diagnosed with gallstones that have no symptoms and you get: Belly pain. Discomfort, burning, or fullness in the upper part of your abdomen. You keep feeling like you may vomit. You have dark pee or pale poop. Get help right away if: You have pain in your abdomen, that: Lasts more than 5 hours. Keeps getting worse. You have a fever or chills. You can't stop vomiting. Your skin or the white parts of your eyes turn yellow. This information is not intended to replace advice given to you by your health care provider. Make sure you discuss any questions you have with your health care provider. Document Revised: 12/24/2021 Document Reviewed: 12/24/2021 Elsevier Patient Education  [place], MD [facility]

## 2022-10-02 ENCOUNTER — Ambulatory Visit: Payer: BC Managed Care – PPO | Admitting: Emergency Medicine

## 2022-10-02 ENCOUNTER — Ambulatory Visit (INDEPENDENT_AMBULATORY_CARE_PROVIDER_SITE_OTHER): Payer: BC Managed Care – PPO

## 2022-10-02 ENCOUNTER — Encounter: Payer: Self-pay | Admitting: Emergency Medicine

## 2022-10-02 VITALS — BP 120/68 | HR 72 | Temp 98.4°F | Ht 62.0 in | Wt 132.2 lb

## 2022-10-02 DIAGNOSIS — M545 Low back pain, unspecified: Secondary | ICD-10-CM

## 2022-10-02 MED ORDER — MELOXICAM 15 MG PO TABS: 15.0000 mg | ORAL_TABLET | Freq: Every day | ORAL | 0 refills | Status: AC

## 2022-10-02 NOTE — Assessment & Plan Note (Signed)
Mechanical in nature.  Clinically stable. No red flag signs or symptoms. Differential diagnosis discussed X-rays done today.  Report reviewed. Pain management discussed Recommend meloxicam 15 mg daily for 7 to 10 days May need orthopedic evaluation.

## 2022-10-02 NOTE — Patient Instructions (Signed)
Acute Back Pain, Adult Acute back pain is sudden and usually short-lived. It is often caused by an injury to the muscles and tissues in the back. The injury may result from: A muscle, tendon, or ligament getting overstretched or torn. Ligaments are tissues that connect bones to each other. Lifting something improperly can cause a back strain. Wear and tear (degeneration) of the spinal disks. Spinal disks are circular tissue that provide cushioning between the bones of the spine (vertebrae). Twisting motions, such as while playing sports or doing yard work. A hit to the back. Arthritis. You may have a physical exam, lab tests, and imaging tests to find the cause of your pain. Acute back pain usually goes away with rest and home care. Follow these instructions at home: Managing pain, stiffness, and swelling Take over-the-counter and prescription medicines only as told by your health care provider. Treatment may include medicines for pain and inflammation that are taken by mouth or applied to the skin, or muscle relaxants. Your health care provider may recommend applying ice during the first 24-48 hours after your pain starts. To do this: Put ice in a plastic bag. Place a towel between your skin and the bag. Leave the ice on for 20 minutes, 2-3 times a day. Remove the ice if your skin turns bright red. This is very important. If you cannot feel pain, heat, or cold, you have a greater risk of damage to the area. If directed, apply heat to the affected area as often as told by your health care provider. Use the heat source that your health care provider recommends, such as a moist heat pack or a heating pad. Place a towel between your skin and the heat source. Leave the heat on for 20-30 minutes. Remove the heat if your skin turns bright red. This is especially important if you are unable to feel pain, heat, or cold. You have a greater risk of getting burned. Activity  Do not stay in bed. Staying in  bed for more than 1-2 days can delay your recovery. Sit up and stand up straight. Avoid leaning forward when you sit or hunching over when you stand. If you work at a desk, sit close to it so you do not need to lean over. Keep your chin tucked in. Keep your neck drawn back, and keep your elbows bent at a 90-degree angle (right angle). Sit high and close to the steering wheel when you drive. Add lower back (lumbar) support to your car seat, if needed. Take short walks on even surfaces as soon as you are able. Try to increase the length of time you walk each day. Do not sit, drive, or stand in one place for more than 30 minutes at a time. Sitting or standing for long periods of time can put stress on your back. Do not drive or use heavy machinery while taking prescription pain medicine. Use proper lifting techniques. When you bend and lift, use positions that put less stress on your back: Bend your knees. Keep the load close to your body. Avoid twisting. Exercise regularly as told by your health care provider. Exercising helps your back heal faster and helps prevent back injuries by keeping muscles strong and flexible. Work with a physical therapist to make a safe exercise program, as recommended by your health care provider. Do any exercises as told by your physical therapist. Lifestyle Maintain a healthy weight. Extra weight puts stress on your back and makes it difficult to have good   posture. Avoid activities or situations that make you feel anxious or stressed. Stress and anxiety increase muscle tension and can make back pain worse. Learn ways to manage anxiety and stress, such as through exercise. General instructions Sleep on a firm mattress in a comfortable position. Try lying on your side with your knees slightly bent. If you lie on your back, put a pillow under your knees. Keep your head and neck in a straight line with your spine (neutral position) when using electronic equipment like  smartphones or pads. To do this: Raise your smartphone or pad to look at it instead of bending your head or neck to look down. Put the smartphone or pad at the level of your face while looking at the screen. Follow your treatment plan as told by your health care provider. This may include: Cognitive or behavioral therapy. Acupuncture or massage therapy. Meditation or yoga. Contact a health care provider if: You have pain that is not relieved with rest or medicine. You have increasing pain going down into your legs or buttocks. Your pain does not improve after 2 weeks. You have pain at night. You lose weight without trying. You have a fever or chills. You develop nausea or vomiting. You develop abdominal pain. Get help right away if: You develop new bowel or bladder control problems. You have unusual weakness or numbness in your arms or legs. You feel faint. These symptoms may represent a serious problem that is an emergency. Do not wait to see if the symptoms will go away. Get medical help right away. Call your local emergency services (911 in the [place] Do not drive yourself to the hospital. Summary Acute back pain is sudden and usually short-lived. Use proper lifting techniques. When you bend and lift, use positions that put less stress on your back. Take over-the-counter and prescription medicines only as told by your health care provider, and apply heat or ice as told. This information is not intended to replace advice given to you by your health care provider. Make sure you discuss any questions you have with your health care provider. Document Revised: 06/02/2020 Document Reviewed: 06/02/2020 Elsevier Patient Education  2024 [employer]  

## 2022-10-02 NOTE — Progress Notes (Signed)
Teresa Morrison 62 y.o.   Chief Complaint  Patient presents with   Back Pain    Patient states she is having some lower back pain, off and on pain x 1-2 weeks     HISTORY OF PRESENT ILLNESS: Acute problem visit today. This is a 62 y.o. female complaining of lumbar pain for the past couple of weeks No associated symptoms Sharp almost constant pain with occasional radiation down the back of her right leg Denies bowel or bladder symptoms Denies any injury. Denies urinary symptoms or hematuria. No other complaints or medical concerns today.  Back Pain Pertinent negatives include no abdominal pain, chest pain, dysuria, fever or headaches.     Prior to Admission medications   Medication Sig Start Date End Date Taking? Authorizing Provider  pantoprazole (PROTONIX) 40 MG tablet Take 1 tablet (40 mg total) by mouth daily. Patient not taking: Reported on 06/16/2020 04/17/20   Janeece Agee, NP    No Known Allergies  Patient Active Problem List   Diagnosis Date Noted   Cholelithiasis 05/14/2022   Cataract of both eyes 04/30/2022   Subclinical hyperthyroidism 07/05/2017   Pure hypercholesterolemia 06/30/2017    Past Medical History:  Diagnosis Date   Allergy     No past surgical history on file.  Social History   Socioeconomic History   Marital status: Married    Spouse name: Not on file   Number of children: 5   Years of education: Not on file   Highest education level: Not on file  Occupational History   Occupation: CNA  Tobacco Use   Smoking status: Never   Smokeless tobacco: Never  Substance and Sexual Activity   Alcohol use: No   Drug use: No   Sexual activity: Yes  Other Topics Concern   Not on file  Social History Narrative   Not on file   Social Determinants of Health   Financial Resource Strain: Not on file  Food Insecurity: Not on file  Transportation Needs: Not on file  Physical Activity: Not on file  Stress: Not on file  Social  Connections: Not on file  Intimate Partner Violence: Not on file    No family history on file.   Review of Systems  Constitutional: Negative.  Negative for chills and fever.  HENT: Negative.  Negative for congestion and sore throat.   Respiratory: Negative.  Negative for cough and shortness of breath.   Cardiovascular: Negative.  Negative for chest pain and palpitations.  Gastrointestinal:  Negative for abdominal pain, diarrhea, nausea and vomiting.  Genitourinary: Negative.  Negative for dysuria, frequency and hematuria.  Musculoskeletal:  Positive for back pain.  Skin: Negative.  Negative for rash.  Neurological: Negative.  Negative for dizziness and headaches.  All other systems reviewed and are negative.   Vitals:   10/02/22 0924  BP: 120/68  Pulse: 72  Temp: 98.4 F (36.9 C)  SpO2: 98%    Physical Exam Vitals reviewed.  Constitutional:      Appearance: Normal appearance.  HENT:     Head: Normocephalic.     Mouth/Throat:     Mouth: Mucous membranes are moist.     Pharynx: Oropharynx is clear.  Eyes:     Extraocular Movements: Extraocular movements intact.     Pupils: Pupils are equal, round, and reactive to light.  Cardiovascular:     Rate and Rhythm: Normal rate and regular rhythm.     Pulses: Normal pulses.     Heart sounds: Normal  heart sounds.  Pulmonary:     Effort: Pulmonary effort is normal.     Breath sounds: Normal breath sounds.  Abdominal:     Palpations: Abdomen is soft.     Tenderness: There is no abdominal tenderness.  Musculoskeletal:     Cervical back: No tenderness.     Lumbar back: Spasms present. No tenderness or bony tenderness. Decreased range of motion. Negative right straight leg raise test and negative left straight leg raise test.  Lymphadenopathy:     Cervical: No cervical adenopathy.  Skin:    General: Skin is warm and dry.     Capillary Refill: Capillary refill takes less than 2 seconds.  Neurological:     General: No focal  deficit present.     Mental Status: She is alert and oriented to person, place, and time.  Psychiatric:        Mood and Affect: Mood normal.        Behavior: Behavior normal.    DG Lumbar Spine 2-3 Views  Result Date: 10/02/2022 CLINICAL DATA:  Low back pain for 2 weeks which is worsening in severity EXAM: LUMBAR SPINE - 2-3 VIEW COMPARISON:  04/30/2022 FINDINGS: Alignment within normal limits. Vertebral body heights maintained. Mild facet degenerative changes at L4-L5 and L5-S1. Mild disc height loss and endplate degenerative changes at T12-L1 and L1-L2. IMPRESSION: 1. No acute abnormality of the lumbar spine. 2. Mild degenerative changes of the lower lumbar spine, predominantly involving the facets. Electronically Signed   By: Acquanetta Belling M.D.   On: 10/02/2022 10:25     ASSESSMENT & PLAN: A total of 33 minutes was spent with the patient and counseling/coordination of care regarding preparing for this visit, review of most recent office visit notes, differential diagnosis of lumbar pain and pain management, review of x-ray report done today, prognosis, documentation, and need for follow-up  Problem List Items Addressed This Visit       Other   Acute bilateral low back pain without sciatica - Primary    Mechanical in nature.  Clinically stable. No red flag signs or symptoms. Differential diagnosis discussed X-rays done today.  Report reviewed. Pain management discussed Recommend meloxicam 15 mg daily for 7 to 10 days May need orthopedic evaluation.      Relevant Medications   meloxicam (MOBIC) 15 MG tablet   Other Relevant Orders   DG Lumbar Spine 2-3 Views   Patient Instructions  Acute Back Pain, Adult Acute back pain is sudden and usually short-lived. It is often caused by an injury to the muscles and tissues in the back. The injury may result from: A muscle, tendon, or ligament getting overstretched or torn. Ligaments are tissues that connect bones to each other. Lifting  something improperly can cause a back strain. Wear and tear (degeneration) of the spinal disks. Spinal disks are circular tissue that provide cushioning between the bones of the spine (vertebrae). Twisting motions, such as while playing sports or doing yard work. A hit to the back. Arthritis. You may have a physical exam, lab tests, and imaging tests to find the cause of your pain. Acute back pain usually goes away with rest and home care. Follow these instructions at home: Managing pain, stiffness, and swelling Take over-the-counter and prescription medicines only as told by your health care provider. Treatment may include medicines for pain and inflammation that are taken by mouth or applied to the skin, or muscle relaxants. Your health care provider may recommend applying ice during the  first 24-48 hours after your pain starts. To do this: Put ice in a plastic bag. Place a towel between your skin and the bag. Leave the ice on for 20 minutes, 2-3 times a day. Remove the ice if your skin turns bright red. This is very important. If you cannot feel pain, heat, or cold, you have a greater risk of damage to the area. If directed, apply heat to the affected area as often as told by your health care provider. Use the heat source that your health care provider recommends, such as a moist heat pack or a heating pad. Place a towel between your skin and the heat source. Leave the heat on for 20-30 minutes. Remove the heat if your skin turns bright red. This is especially important if you are unable to feel pain, heat, or cold. You have a greater risk of getting burned. Activity  Do not stay in bed. Staying in bed for more than 1-2 days can delay your recovery. Sit up and stand up straight. Avoid leaning forward when you sit or hunching over when you stand. If you work at a desk, sit close to it so you do not need to lean over. Keep your chin tucked in. Keep your neck drawn back, and keep your elbows  bent at a 90-degree angle (right angle). Sit high and close to the steering wheel when you drive. Add lower back (lumbar) support to your car seat, if needed. Take short walks on even surfaces as soon as you are able. Try to increase the length of time you walk each day. Do not sit, drive, or stand in one place for more than 30 minutes at a time. Sitting or standing for long periods of time can put stress on your back. Do not drive or use heavy machinery while taking prescription pain medicine. Use proper lifting techniques. When you bend and lift, use positions that put less stress on your back: [place] your knees. Keep the load close to your body. Avoid twisting. Exercise regularly as told by your health care provider. Exercising helps your back heal faster and helps prevent back injuries by keeping muscles strong and flexible. Work with a physical therapist to make a safe exercise program, as recommended by your health care provider. Do any exercises as told by your physical therapist. Lifestyle Maintain a healthy weight. Extra weight puts stress on your back and makes it difficult to have good posture. Avoid activities or situations that make you feel anxious or stressed. Stress and anxiety increase muscle tension and can make back pain worse. Learn ways to manage anxiety and stress, such as through exercise. General instructions Sleep on a firm mattress in a comfortable position. Try lying on your side with your knees slightly bent. If you lie on your back, put a pillow under your knees. Keep your head and neck in a straight line with your spine (neutral position) when using electronic equipment like smartphones or pads. To do this: Raise your smartphone or pad to look at it instead of bending your head or neck to look down. Put the smartphone or pad at the level of your face while looking at the screen. Follow your treatment plan as told by your health care provider. This may  include: Cognitive or behavioral therapy. Acupuncture or massage therapy. Meditation or yoga. Contact a health care provider if: You have pain that is not relieved with rest or medicine. You have increasing pain going down into your legs or  buttocks. Your pain does not improve after 2 weeks. You have pain at night. You lose weight without trying. You have a fever or chills. You develop nausea or vomiting. You develop abdominal pain. Get help right away if: You develop new bowel or bladder control problems. You have unusual weakness or numbness in your arms or legs. You feel faint. These symptoms may represent a serious problem that is an emergency. Do not wait to see if the symptoms will go away. Get medical help right away. Call your local emergency services (911 in the [place] Do not drive yourself to the hospital. Summary Acute back pain is sudden and usually short-lived. Use proper lifting techniques. When you bend and lift, use positions that put less stress on your back. Take over-the-counter and prescription medicines only as told by your health care provider, and apply heat or ice as told. This information is not intended to replace advice given to you by your health care provider. Make sure you discuss any questions you have with your health care provider. Document Revised: 06/02/2020 Document Reviewed: 06/02/2020 Elsevier Patient Education  2024 [employer]     Edwina Barth, MD [facility]

## 2022-10-25 IMAGING — MG DIGITAL SCREENING BILAT W/ CAD
5 series · 5 of 5 positions shown · non-contrast
Comparison: None.

CLINICAL DATA: Screening.

EXAM:
DIGITAL SCREENING BILATERAL MAMMOGRAM WITH CAD
TECHNIQUE: Bilateral screening digital craniocaudal and mediolateral oblique
mammograms were obtained. The images were evaluated with
computer-aided detection.

[R MLO]
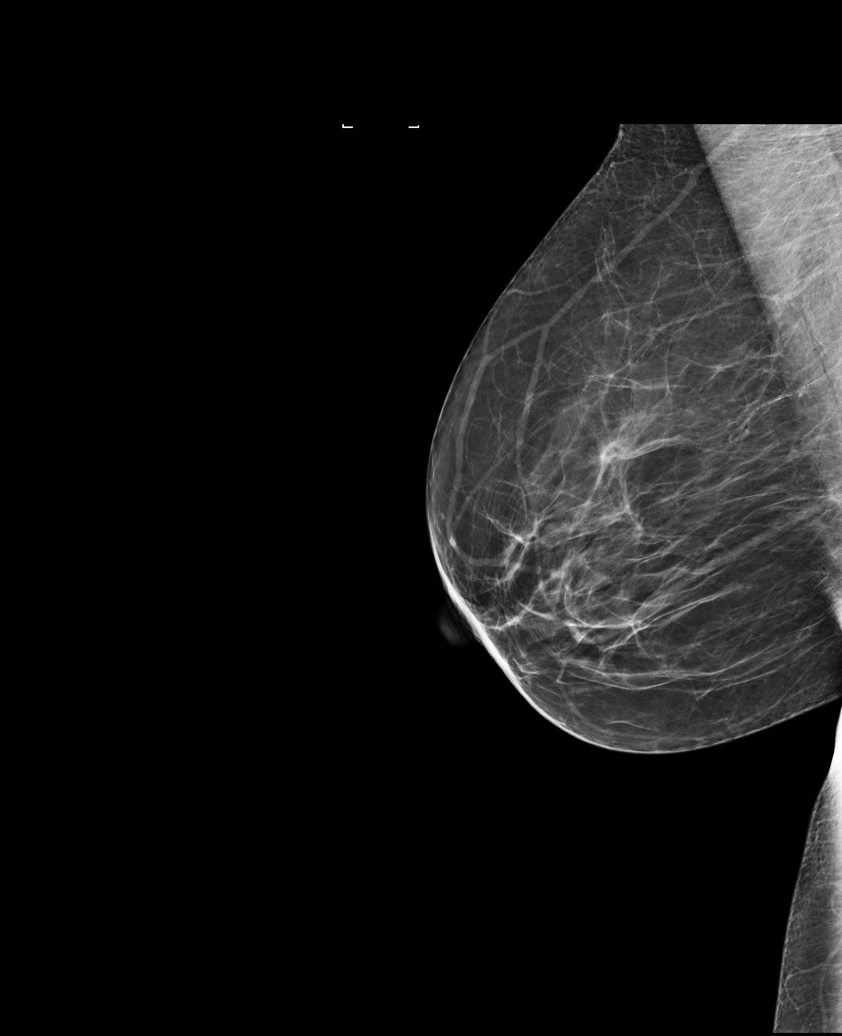

[R CC]
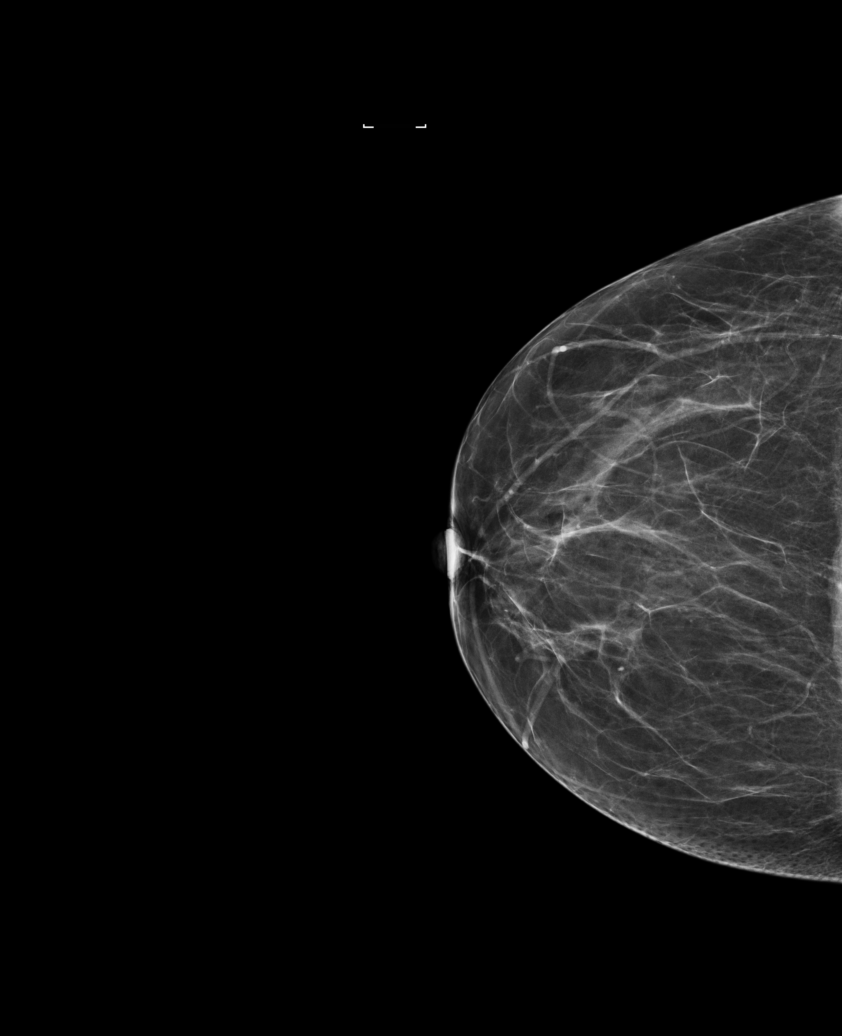

[L MLO (1 of 2)]
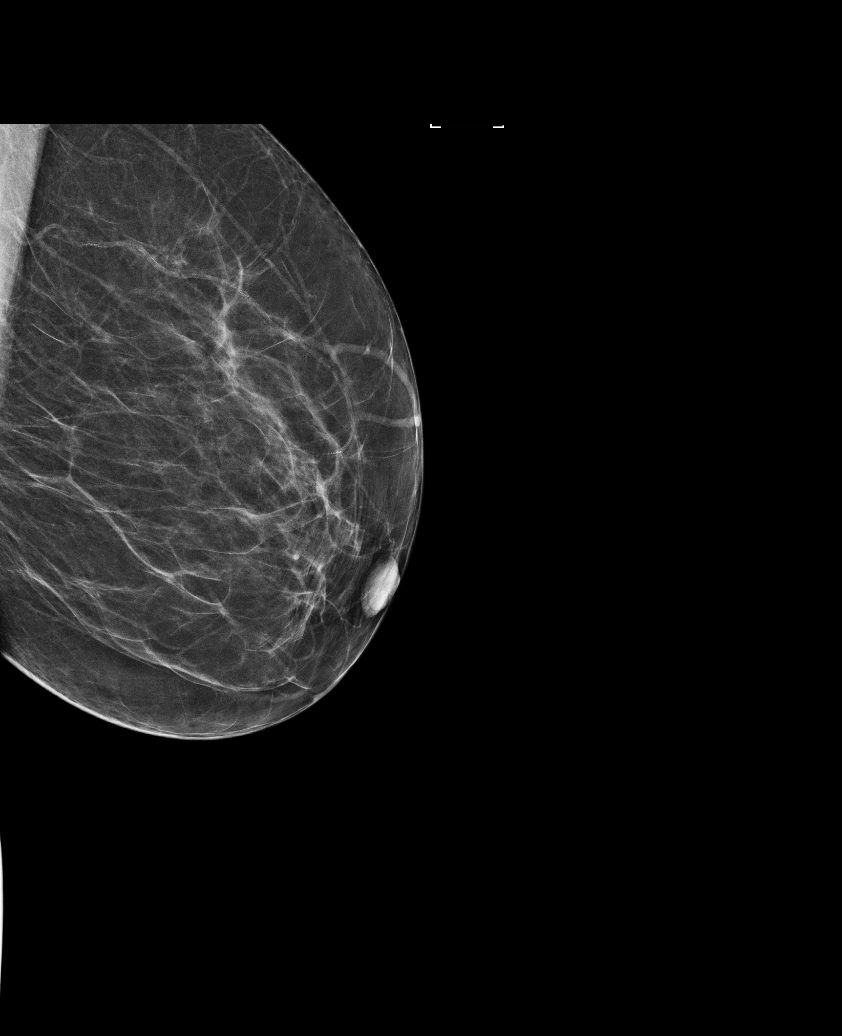

[L CC]
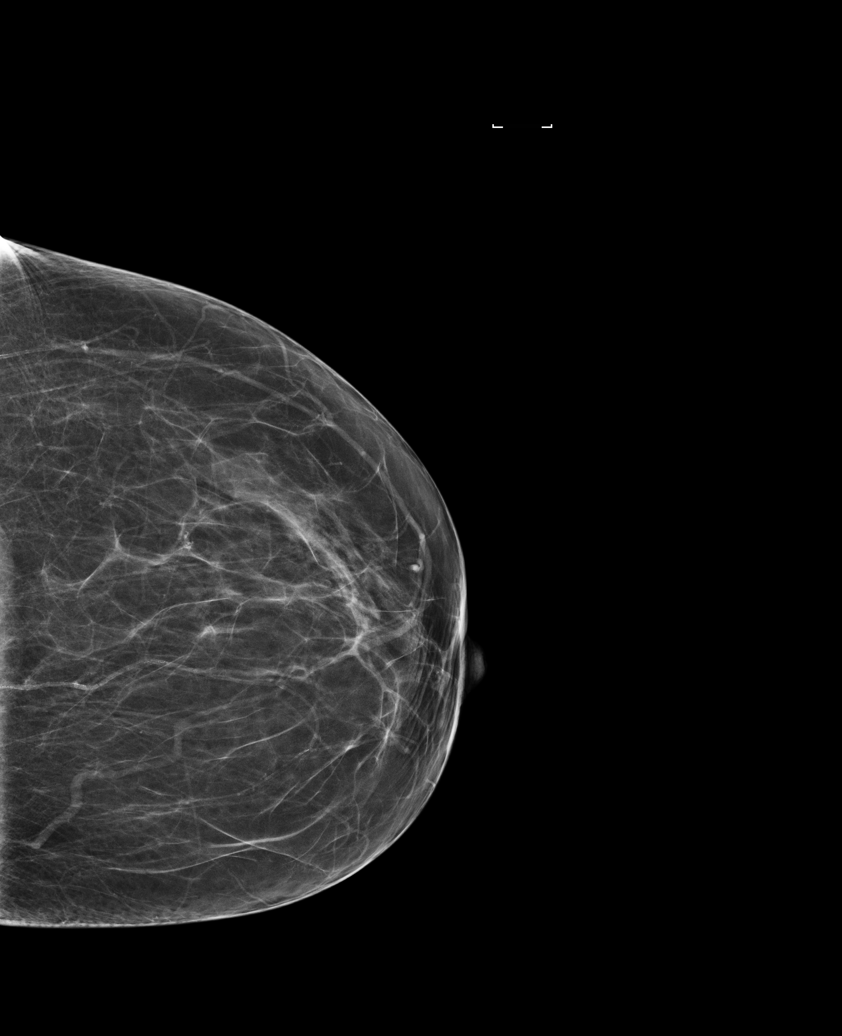

[L MLO (2 of 2)]
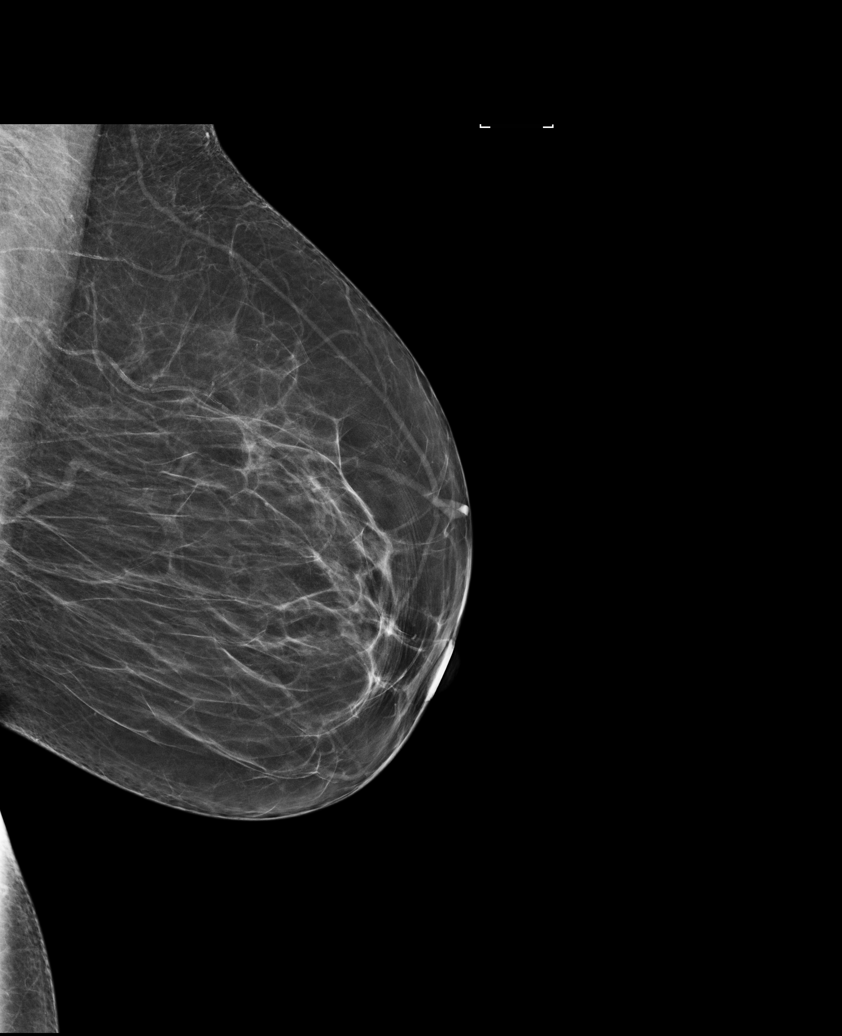

[5 of 5 positions shown; findings below may reference images not displayed]

ACR Breast Density Category b: There are scattered areas of
fibroglandular density.
FINDINGS: There are no findings suspicious for malignancy.
IMPRESSION: No mammographic evidence of malignancy. A result letter of this
screening mammogram will be mailed directly to the patient.

RECOMMENDATION:
Screening mammogram in one year. (Code:[id number])

BI-RADS CATEGORY  1: Negative.

## 2023-02-06 ENCOUNTER — Encounter: Payer: Self-pay | Admitting: Emergency Medicine

## 2023-02-06 ENCOUNTER — Ambulatory Visit: Payer: BC Managed Care – PPO | Admitting: Emergency Medicine

## 2023-02-06 VITALS — BP 116/80 | HR 77 | Temp 98.2°F | Ht 62.0 in | Wt 131.0 lb

## 2023-02-06 DIAGNOSIS — K8020 Calculus of gallbladder without cholecystitis without obstruction: Secondary | ICD-10-CM

## 2023-02-06 DIAGNOSIS — R42 Dizziness and giddiness: Secondary | ICD-10-CM | POA: Diagnosis not present

## 2023-02-06 LAB — COMPREHENSIVE METABOLIC PANEL
ALT: 14 U/L (ref 0–35)
AST: 22 U/L (ref 0–37)
Albumin: 3.9 g/dL (ref 3.5–5.2)
Alkaline Phosphatase: 72 U/L (ref 39–117)
BUN: 12 mg/dL (ref 6–23)
CO2: 31 meq/L (ref 19–32)
Calcium: 9.4 mg/dL (ref 8.4–10.5)
Chloride: 104 meq/L (ref 96–112)
Creatinine, Ser: 0.78 mg/dL (ref 0.40–1.20)
GFR: 81.08 mL/min (ref >60.00)
Glucose, Bld: 74 mg/dL (ref 70–99)
Potassium: 4.3 meq/L (ref 3.5–5.1)
Sodium: 140 meq/L (ref 135–145)
Total Bilirubin: 0.4 mg/dL (ref 0.2–1.2)
Total Protein: 7.7 g/dL (ref 6.0–8.3)

## 2023-02-06 LAB — CBC WITH DIFFERENTIAL/PLATELET
Basophils Absolute: 0.1 10*3/uL (ref 0.0–0.1)
Basophils Relative: 1.1 % (ref 0.0–3.0)
Eosinophils Absolute: 0.6 10*3/uL (ref 0.0–0.7)
Eosinophils Relative: 9.4 % — ABNORMAL HIGH (ref 0.0–5.0)
HCT: 40.9 % (ref 36.0–46.0)
Hemoglobin: 13.6 g/dL (ref 12.0–15.0)
Lymphocytes Relative: 33.5 % (ref 12.0–46.0)
Lymphs Abs: 2.2 10*3/uL (ref 0.7–4.0)
MCHC: 33.3 g/dL (ref 30.0–36.0)
MCV: 92.4 fL (ref 78.0–100.0)
Monocytes Absolute: 0.5 10*3/uL (ref 0.1–1.0)
Monocytes Relative: 7.9 % (ref 3.0–12.0)
Neutro Abs: 3.2 10*3/uL (ref 1.4–7.7)
Neutrophils Relative %: 48.1 % (ref 43.0–77.0)
Platelets: 314 10*3/uL (ref 150.0–400.0)
RBC: 4.43 Mil/uL (ref 3.87–5.11)
RDW: 13.1 % (ref 11.5–15.5)
WBC: 6.6 10*3/uL (ref 4.0–10.5)

## 2023-02-06 LAB — URINALYSIS
Bilirubin Urine: NEGATIVE
Hgb urine dipstick: NEGATIVE
Ketones, ur: NEGATIVE
Leukocytes,Ua: NEGATIVE
Nitrite: NEGATIVE
Specific Gravity, Urine: 1.02 (ref 1.000–1.030)
Total Protein, Urine: NEGATIVE
Urine Glucose: NEGATIVE
Urobilinogen, UA: 0.2 (ref 0.0–1.0)
pH: 6 (ref 5.0–8.0)

## 2023-02-06 LAB — LIPASE: Lipase: 42 U/L (ref 11.0–59.0)

## 2023-02-06 NOTE — Assessment & Plan Note (Signed)
Symptomatic and affecting quality of life Recommend follow-up with general surgeon for possible cholecystectomy Diet and nutrition discussed Pain management discussed Clinically stable without complications Blood work done today

## 2023-02-06 NOTE — Patient Instructions (Signed)
Cholelithiasis  Cholelithiasis happens when gallstones form in the gallbladder. The gallbladder stores bile. Bile is a fluid that helps digest fats. Bile can harden and form into gallstones. If they cause a blockage, they can cause pain (gallbladder attack). What are the causes? This condition may be caused by: Too much bilirubin in the bile. This happens if you have sickle cell anemia. Too much of a fat-like substance (cholesterol) in your bile. Not enough bile salts in your bile. These salts help the body absorb and digest fats. The gallbladder not emptying fully or often enough. This is common in pregnant women. What increases the risk? The following factors may make you more likely to develop this condition: Being older than age 3. Eating a lot of fried foods, fat, and refined carbs (refined carbohydrates). Being female. Being pregnant many times. Using medicines with female hormones in them for a long time. Losing weight fast. Having gallstones in your family. Having health problems, such as diabetes, obesity, Crohn's disease, or liver disease. What are the signs or symptoms? Often, there may be gallstones but no symptoms. These gallstones are called silent gallstones. If a gallstone causes a blockage, you may get sudden pain. The pain: Can be in the upper right part of your belly (abdomen). Normally comes at night or after you eat. Can last an hour or more. Can spread to your right shoulder, back, or chest. Can feel like discomfort, burning, or fullness in the upper part of your belly (indigestion). If the blockage lasts more than a few hours, you can get an infection or swelling. You may: Vomit or feel like you may vomit (nauseous). Feel bloated. Have belly pain for 5 hours or more. Feel tender in your belly, often in the upper right part and under your ribs. Have a fever or chills. Have skin or the white parts of your eyes turn yellow (jaundice). Have dark pee (urine) or  pale poop (stool). How is this treated? Treatment for this condition depends on how bad you feel. If you have symptoms, you may need: Home care, if symptoms are not very bad. Do not eat for 12-24 hours. Drink only water and clear liquids. After 1 or 2 days, start to eat simple or clear foods. Try broth and crackers. You may need medicines for pain or stomach upset or both. If you have an infection, you will need antibiotics. A hospital stay, if you have very bad pain or a very bad infection. Surgery to remove your gallbladder. You may need this if: Gallstones keep coming back. You have very bad symptoms. Medicines to break up gallstones. Medicines may be used for 6-12 months. A procedure to find and take out gallstones or to break up gallstones. Follow these instructions at home: Medicines Take over-the-counter and prescription medicines only as told by your doctor. If you were prescribed antibiotics, take them as told by your doctor. Do not stop taking them even if you start to feel better. Ask your doctor if you should avoid driving or using machines while you are taking your medicine. Eating and drinking Drink enough fluid to keep your pee pale yellow. Drink water or clear fluids. This is important when you have pain. Eat healthy foods. Choose: Fewer fatty foods, such as fried foods. Fewer refined carbs. Avoid breads and grains that are highly processed, such as white bread and white rice. Choose whole grains, such as whole-wheat bread and brown rice. More fiber. Almonds, fresh fruit, and beans are healthy sources. General  instructions Keep a healthy weight. Keep all follow-up visits. You may need to see a specialist or a Careers adviser. Where to find more information [employer] and Digestive [employer] Kidney Diseases: [email or web address] Contact a doctor if: You have sudden pain in the upper right part of your belly. Pain might spread to your right shoulder, back, or chest. Your  pain lasts more than 2 hours. You have been diagnosed with gallstones that have no symptoms and you get: Belly pain. Discomfort, burning, or fullness in the upper part of your abdomen. You keep feeling like you may vomit. You have dark pee or pale poop. Get help right away if: You have pain in your abdomen, that: Lasts more than 5 hours. Keeps getting worse. You have a fever or chills. You can't stop vomiting. Your skin or the white parts of your eyes turn yellow. This information is not intended to replace advice given to you by your health care provider. Make sure you discuss any questions you have with your health care provider. Document Revised: 12/24/2021 Document Reviewed: 12/24/2021 Elsevier Patient Education  2024 [employer].

## 2023-02-06 NOTE — Assessment & Plan Note (Signed)
Of recent onset.  Clinically stable.  No red flag signs or symptoms Most likely viral illness Advised to rest and stay well-hydrated

## 2023-02-06 NOTE — Progress Notes (Signed)
Teresa Morrison 62 y.o.   Chief Complaint  Patient presents with   Shoulder Pain    Patient states she is having some pain in her shoulder   Abdominal Pain    Patient is having some pain in her abd, she states she had a scan done and she has gallstones. She states the pain has come back     HISTORY OF PRESENT ILLNESS: This is a 62 y.o. female complaining of pain to right of her quadrant with some radiation into her right shoulder Patient has history of cholelithiasis diagnosed earlier this year.  Was referred to general surgery and was evaluated by Dr. Derrell Lolling who recommended lap chole if symptoms persisted. Also complaining of 1 week history of intermittent dizziness with nausea.  No other associated symptoms No other complaints or medical concerns today.  Shoulder Pain  Pertinent negatives include no fever.  Abdominal Pain Associated symptoms include nausea. Pertinent negatives include no dysuria, fever or hematuria.     Prior to Admission medications   Medication Sig Start Date End Date Taking? Authorizing Provider  pantoprazole (PROTONIX) 40 MG tablet Take 1 tablet (40 mg total) by mouth daily. Patient not taking: Reported on 06/16/2020 04/17/20   Janeece Agee, NP    No Known Allergies  Patient Active Problem List   Diagnosis Date Noted   Cholelithiasis 05/14/2022   Acute bilateral low back pain without sciatica 04/30/2022   Cataract of both eyes 04/30/2022   Subclinical hyperthyroidism 07/05/2017   Pure hypercholesterolemia 06/30/2017    Past Medical History:  Diagnosis Date   Allergy     No past surgical history on file.  Social History   Socioeconomic History   Marital status: Married    Spouse name: Not on file   Number of children: 5   Years of education: Not on file   Highest education level: Not on file  Occupational History   Occupation: CNA  Tobacco Use   Smoking status: Never   Smokeless tobacco: Never  Substance and Sexual Activity    Alcohol use: No   Drug use: No   Sexual activity: Yes  Other Topics Concern   Not on file  Social History Narrative   Not on file   Social Determinants of Health   Financial Resource Strain: Not on file  Food Insecurity: Not on file  Transportation Needs: Not on file  Physical Activity: Not on file  Stress: Not on file  Social Connections: Not on file  Intimate Partner Violence: Not on file    No family history on file.   Review of Systems  Constitutional: Negative.  Negative for chills and fever.  HENT: Negative.  Negative for congestion and sore throat.   Respiratory: Negative.  Negative for cough and shortness of breath.   Cardiovascular: Negative.  Negative for chest pain and palpitations.  Gastrointestinal:  Positive for abdominal pain and nausea.  Genitourinary: Negative.  Negative for dysuria and hematuria.  Skin: Negative.  Negative for rash.  Neurological:  Positive for dizziness.  All other systems reviewed and are negative.   Vitals:   02/06/23 0945  BP: 116/80  Pulse: 77  Temp: 98.2 F (36.8 C)  SpO2: 98%    Physical Exam Vitals reviewed.  Constitutional:      Appearance: She is well-developed.  HENT:     Head: Normocephalic.     Right Ear: Tympanic membrane, ear canal and external ear normal.     Left Ear: Tympanic membrane, ear canal and  external ear normal.     Mouth/Throat:     Mouth: Mucous membranes are moist.     Pharynx: Oropharynx is clear.  Eyes:     Extraocular Movements: Extraocular movements intact.     Conjunctiva/sclera: Conjunctivae normal.     Pupils: Pupils are equal, round, and reactive to light.  Cardiovascular:     Rate and Rhythm: Normal rate and regular rhythm.     Pulses: Normal pulses.     Heart sounds: Normal heart sounds.  Pulmonary:     Effort: Pulmonary effort is normal.     Breath sounds: Normal breath sounds.  Abdominal:     Palpations: Abdomen is soft.     Tenderness: There is abdominal tenderness (To deep  palpation of right upper quadrant). There is no guarding or rebound.  Musculoskeletal:     Cervical back: No tenderness.  Lymphadenopathy:     Cervical: No cervical adenopathy.  Skin:    General: Skin is warm and dry.     Capillary Refill: Capillary refill takes less than 2 seconds.  Neurological:     General: No focal deficit present.     Mental Status: She is alert and oriented to person, place, and time.  Psychiatric:        Mood and Affect: Mood normal.        Behavior: Behavior normal.      ASSESSMENT & PLAN: A total of 42 minutes was spent with the patient and counseling/coordination of care regarding preparing for this visit, review of most recent office visit notes, review of most recent blood work results, review of most recent gallbladder ultrasound report, review of most recent general surgeon's office visit notes, review of chronic medical conditions under management, review of all medications, need for blood work today, symptom management, prognosis, need for follow-up with general surgeon, documentation and need for follow-up.  Problem List Items Addressed This Visit       Digestive   Cholelithiasis - Primary    Symptomatic and affecting quality of life Recommend follow-up with general surgeon for possible cholecystectomy Diet and nutrition discussed Pain management discussed Clinically stable without complications Blood work done today      Relevant Orders   Urinalysis   Lipase   CBC with Differential/Platelet   Comprehensive metabolic panel     Other   Dizziness    Of recent onset.  Clinically stable.  No red flag signs or symptoms Most likely viral illness Advised to rest and stay well-hydrated      Relevant Orders   Urinalysis   Lipase   CBC with Differential/Platelet   Comprehensive metabolic panel   Patient Instructions  Cholelithiasis  Cholelithiasis happens when gallstones form in the gallbladder. The gallbladder stores bile. Bile is a  fluid that helps digest fats. Bile can harden and form into gallstones. If they cause a blockage, they can cause pain (gallbladder attack). What are the causes? This condition may be caused by: Too much bilirubin in the bile. This happens if you have sickle cell anemia. Too much of a fat-like substance (cholesterol) in your bile. Not enough bile salts in your bile. These salts help the body absorb and digest fats. The gallbladder not emptying fully or often enough. This is common in pregnant women. What increases the risk? The following factors may make you more likely to develop this condition: Being older than age 42. Eating a lot of fried foods, fat, and refined carbs (refined carbohydrates). Being female. Being pregnant many  times. Using medicines with female hormones in them for a long time. Losing weight fast. Having gallstones in your family. Having health problems, such as diabetes, obesity, Crohn's disease, or liver disease. What are the signs or symptoms? Often, there may be gallstones but no symptoms. These gallstones are called silent gallstones. If a gallstone causes a blockage, you may get sudden pain. The pain: Can be in the upper right part of your belly (abdomen). Normally comes at night or after you eat. Can last an hour or more. Can spread to your right shoulder, back, or chest. Can feel like discomfort, burning, or fullness in the upper part of your belly (indigestion). If the blockage lasts more than a few hours, you can get an infection or swelling. You may: Vomit or feel like you may vomit (nauseous). Feel bloated. Have belly pain for 5 hours or more. Feel tender in your belly, often in the upper right part and under your ribs. Have a fever or chills. Have skin or the white parts of your eyes turn yellow (jaundice). Have dark pee (urine) or pale poop (stool). How is this treated? Treatment for this condition depends on how bad you feel. If you have symptoms,  you may need: Home care, if symptoms are not very bad. Do not eat for 12-24 hours. Drink only water and clear liquids. After 1 or 2 days, start to eat simple or clear foods. Try broth and crackers. You may need medicines for pain or stomach upset or both. If you have an infection, you will need antibiotics. A hospital stay, if you have very bad pain or a very bad infection. Surgery to remove your gallbladder. You may need this if: Gallstones keep coming back. You have very bad symptoms. Medicines to break up gallstones. Medicines may be used for 6-12 months. A procedure to find and take out gallstones or to break up gallstones. Follow these instructions at home: Medicines Take over-the-counter and prescription medicines only as told by your doctor. If you were prescribed antibiotics, take them as told by your doctor. Do not stop taking them even if you start to feel better. Ask your doctor if you should avoid driving or using machines while you are taking your medicine. Eating and drinking Drink enough fluid to keep your pee pale yellow. Drink water or clear fluids. This is important when you have pain. Eat healthy foods. Choose: Fewer fatty foods, such as fried foods. Fewer refined carbs. Avoid breads and grains that are highly processed, such as white bread and white rice. Choose whole grains, such as whole-wheat bread and brown rice. More fiber. Almonds, fresh fruit, and beans are healthy sources. General instructions Keep a healthy weight. Keep all follow-up visits. You may need to see a specialist or a Careers adviser. Where to find more information [employer] of Diabetes and Digestive and Kidney Diseases: [employer] Contact a doctor if: You have sudden pain in the upper right part of your belly. Pain might spread to your right shoulder, back, or chest. Your pain lasts more than 2 hours. You have been diagnosed with gallstones that have no symptoms and you get: Belly  pain. Discomfort, burning, or fullness in the upper part of your abdomen. You keep feeling like you may vomit. You have dark pee or pale poop. Get help right away if: You have pain in your abdomen, that: Lasts more than 5 hours. Keeps getting worse. You have a fever or chills. You can't stop vomiting. Your skin or the  white parts of your eyes turn yellow. This information is not intended to replace advice given to you by your health care provider. Make sure you discuss any questions you have with your health care provider. Document Revised: 12/24/2021 Document Reviewed: 12/24/2021 Elsevier Patient Education  2024 Elsevier Inc.    Edwina Barth, MD  Primary Care at [facility]

## 2023-04-16 ENCOUNTER — Ambulatory Visit (INDEPENDENT_AMBULATORY_CARE_PROVIDER_SITE_OTHER): Payer: BC Managed Care – PPO | Admitting: Emergency Medicine

## 2023-04-16 ENCOUNTER — Encounter: Payer: Self-pay | Admitting: Emergency Medicine

## 2023-04-16 VITALS — BP 118/68 | HR 74 | Temp 98.3°F | Ht 62.0 in | Wt 131.0 lb

## 2023-04-16 DIAGNOSIS — E785 Hyperlipidemia, unspecified: Secondary | ICD-10-CM | POA: Insufficient documentation

## 2023-04-16 DIAGNOSIS — K8020 Calculus of gallbladder without cholecystitis without obstruction: Secondary | ICD-10-CM | POA: Diagnosis not present

## 2023-04-16 NOTE — Patient Instructions (Signed)
Cholelithiasis  Cholelithiasis happens when gallstones form in the gallbladder. The gallbladder stores bile. Bile is a fluid that helps digest fats. Bile can harden and form into gallstones. If they cause a blockage, they can cause pain (gallbladder attack). What are the causes? This condition may be caused by: Too much bilirubin in the bile. This happens if you have sickle cell anemia. Too much of a fat-like substance (cholesterol) in your bile. Not enough bile salts in your bile. These salts help the body absorb and digest fats. The gallbladder not emptying fully or often enough. This is common in pregnant women. What increases the risk? The following factors may make you more likely to develop this condition: Being older than age 3. Eating a lot of fried foods, fat, and refined carbs (refined carbohydrates). Being female. Being pregnant many times. Using medicines with female hormones in them for a long time. Losing weight fast. Having gallstones in your family. Having health problems, such as diabetes, obesity, Crohn's disease, or liver disease. What are the signs or symptoms? Often, there may be gallstones but no symptoms. These gallstones are called silent gallstones. If a gallstone causes a blockage, you may get sudden pain. The pain: Can be in the upper right part of your belly (abdomen). Normally comes at night or after you eat. Can last an hour or more. Can spread to your right shoulder, back, or chest. Can feel like discomfort, burning, or fullness in the upper part of your belly (indigestion). If the blockage lasts more than a few hours, you can get an infection or swelling. You may: Vomit or feel like you may vomit (nauseous). Feel bloated. Have belly pain for 5 hours or more. Feel tender in your belly, often in the upper right part and under your ribs. Have a fever or chills. Have skin or the white parts of your eyes turn yellow (jaundice). Have dark pee (urine) or  pale poop (stool). How is this treated? Treatment for this condition depends on how bad you feel. If you have symptoms, you may need: Home care, if symptoms are not very bad. Do not eat for 12-24 hours. Drink only water and clear liquids. After 1 or 2 days, start to eat simple or clear foods. Try broth and crackers. You may need medicines for pain or stomach upset or both. If you have an infection, you will need antibiotics. A hospital stay, if you have very bad pain or a very bad infection. Surgery to remove your gallbladder. You may need this if: Gallstones keep coming back. You have very bad symptoms. Medicines to break up gallstones. Medicines may be used for 6-12 months. A procedure to find and take out gallstones or to break up gallstones. Follow these instructions at home: Medicines Take over-the-counter and prescription medicines only as told by your doctor. If you were prescribed antibiotics, take them as told by your doctor. Do not stop taking them even if you start to feel better. Ask your doctor if you should avoid driving or using machines while you are taking your medicine. Eating and drinking Drink enough fluid to keep your pee pale yellow. Drink water or clear fluids. This is important when you have pain. Eat healthy foods. Choose: Fewer fatty foods, such as fried foods. Fewer refined carbs. Avoid breads and grains that are highly processed, such as white bread and white rice. Choose whole grains, such as whole-wheat bread and brown rice. More fiber. Almonds, fresh fruit, and beans are healthy sources. General  instructions Keep a healthy weight. Keep all follow-up visits. You may need to see a specialist or a Careers adviser. Where to find more information [employer] and Digestive [employer] Kidney Diseases: [email or web address] Contact a doctor if: You have sudden pain in the upper right part of your belly. Pain might spread to your right shoulder, back, or chest. Your  pain lasts more than 2 hours. You have been diagnosed with gallstones that have no symptoms and you get: Belly pain. Discomfort, burning, or fullness in the upper part of your abdomen. You keep feeling like you may vomit. You have dark pee or pale poop. Get help right away if: You have pain in your abdomen, that: Lasts more than 5 hours. Keeps getting worse. You have a fever or chills. You can't stop vomiting. Your skin or the white parts of your eyes turn yellow. This information is not intended to replace advice given to you by your health care provider. Make sure you discuss any questions you have with your health care provider. Document Revised: 12/24/2021 Document Reviewed: 12/24/2021 Elsevier Patient Education  2024 [employer].

## 2023-04-16 NOTE — Progress Notes (Signed)
Teresa Morrison 63 y.o.   Chief Complaint  Patient presents with   Results    Patient has questions from 10/02/22 Xray. Concerns on how severe the gallbladder surgery is needed, wanting to know the size, position. She's has already cancelled the surgery. She is wanting more details on this     HISTORY OF PRESENT ILLNESS: This is a 63 y.o. female with history of cholelithiasis diagnosed on February 2024 Was able to follow-up with surgeon who recommended elective surgery However symptoms have become less and less frequent and does not want to get surgery at present time Requesting more information about gallbladder disease  HPI   Prior to Admission medications   Medication Sig Start Date End Date Taking? Authorizing Provider  pantoprazole (PROTONIX) 40 MG tablet Take 1 tablet (40 mg total) by mouth daily. Patient not taking: Reported on 04/16/2023 04/17/20   Janeece Agee, NP    No Known Allergies  Patient Active Problem List   Diagnosis Date Noted   Dyslipidemia 04/16/2023   Cholelithiasis 05/14/2022   Cataract of both eyes 04/30/2022   Subclinical hyperthyroidism 07/05/2017   Pure hypercholesterolemia 06/30/2017    Past Medical History:  Diagnosis Date   Allergy     No past surgical history on file.  Social History   Socioeconomic History   Marital status: Married    Spouse name: Not on file   Number of children: 5   Years of education: Not on file   Highest education level: Not on file  Occupational History   Occupation: CNA  Tobacco Use   Smoking status: Never   Smokeless tobacco: Never  Substance and Sexual Activity   Alcohol use: No   Drug use: No   Sexual activity: Yes  Other Topics Concern   Not on file  Social History Narrative   Not on file   Social Drivers of Health   Financial Resource Strain: Not on file  Food Insecurity: Not on file  Transportation Needs: Not on file  Physical Activity: Not on file  Stress: Not on file  Social  Connections: Not on file  Intimate Partner Violence: Not on file    No family history on file.   Review of Systems  Constitutional: Negative.  Negative for chills and fever.  HENT: Negative.  Negative for congestion and sore throat.   Respiratory: Negative.  Negative for cough and shortness of breath.   Cardiovascular: Negative.  Negative for chest pain and palpitations.  Gastrointestinal:  Negative for abdominal pain, diarrhea, nausea and vomiting.  Genitourinary: Negative.  Negative for dysuria and hematuria.  Skin: Negative.  Negative for rash.  Neurological: Negative.  Negative for dizziness and headaches.  All other systems reviewed and are negative.   Vitals:   04/16/23 0942  BP: 118/68  Pulse: 74  Temp: 98.3 F (36.8 C)  SpO2: 98%    Physical Exam Vitals reviewed.  Constitutional:      Appearance: Normal appearance.  HENT:     Head: Normocephalic.  Eyes:     Extraocular Movements: Extraocular movements intact.  Cardiovascular:     Rate and Rhythm: Normal rate and regular rhythm.  Pulmonary:     Effort: Pulmonary effort is normal.     Breath sounds: Normal breath sounds.  Abdominal:     Palpations: Abdomen is soft.     Tenderness: There is no abdominal tenderness.  Skin:    General: Skin is warm and dry.  Neurological:     Mental Status: She  is alert and oriented to person, place, and time.  Psychiatric:        Mood and Affect: Mood normal.        Behavior: Behavior normal.      ASSESSMENT & PLAN: A total of 42 minutes was spent with the patient and counseling/coordination of care regarding preparing for this visit, review of most recent office visit notes, review of multiple chronic medical conditions and their management, diagnosis and management of gallbladder disease, review of all medications, review of most recent bloodwork results, review of health maintenance items, education on nutrition, prognosis, documentation, and need for follow  up.   Problem List Items Addressed This Visit       Digestive   Cholelithiasis - Primary   Since our last discussion she was able to follow-up with surgeon who recommended surgery as an elective procedure However biliary colic episodes have become less and less frequent. Does not want to get surgery at present time Well-controlled and not affecting quality of life Diet and nutrition discussed Clinically stable.  No concerns.        Other   Dyslipidemia   Diet and nutrition discussed. Benefits of exercise discussed. The 10-year ASCVD risk score (Arnett DK, et al., 2019) is: 5.1%   Values used to calculate the score:     Age: 39 years     Sex: Female     Is Non-Hispanic African American: Yes     Diabetic: No     Tobacco smoker: No     Systolic Blood Pressure: 118 mmHg     Is BP treated: No     HDL Cholesterol: 69.9 mg/dL     Total Cholesterol: 202 mg/dL       Patient Instructions  Cholelithiasis  Cholelithiasis happens when gallstones form in the gallbladder. The gallbladder stores bile. Bile is a fluid that helps digest fats. Bile can harden and form into gallstones. If they cause a blockage, they can cause pain (gallbladder attack). What are the causes? This condition may be caused by: Too much bilirubin in the bile. This happens if you have sickle cell anemia. Too much of a fat-like substance (cholesterol) in your bile. Not enough bile salts in your bile. These salts help the body absorb and digest fats. The gallbladder not emptying fully or often enough. This is common in pregnant women. What increases the risk? The following factors may make you more likely to develop this condition: Being older than age 11. Eating a lot of fried foods, fat, and refined carbs (refined carbohydrates). Being female. Being pregnant many times. Using medicines with female hormones in them for a long time. Losing weight fast. Having gallstones in your family. Having health  problems, such as diabetes, obesity, Crohn's disease, or liver disease. What are the signs or symptoms? Often, there may be gallstones but no symptoms. These gallstones are called silent gallstones. If a gallstone causes a blockage, you may get sudden pain. The pain: Can be in the upper right part of your belly (abdomen). Normally comes at night or after you eat. Can last an hour or more. Can spread to your right shoulder, back, or chest. Can feel like discomfort, burning, or fullness in the upper part of your belly (indigestion). If the blockage lasts more than a few hours, you can get an infection or swelling. You may: Vomit or feel like you may vomit (nauseous). Feel bloated. Have belly pain for 5 hours or more. Feel tender in your belly, often  in the upper right part and under your ribs. Have a fever or chills. Have skin or the white parts of your eyes turn yellow (jaundice). Have dark pee (urine) or pale poop (stool). How is this treated? Treatment for this condition depends on how bad you feel. If you have symptoms, you may need: Home care, if symptoms are not very bad. Do not eat for 12-24 hours. Drink only water and clear liquids. After 1 or 2 days, start to eat simple or clear foods. Try broth and crackers. You may need medicines for pain or stomach upset or both. If you have an infection, you will need antibiotics. A hospital stay, if you have very bad pain or a very bad infection. Surgery to remove your gallbladder. You may need this if: Gallstones keep coming back. You have very bad symptoms. Medicines to break up gallstones. Medicines may be used for 6-12 months. A procedure to find and take out gallstones or to break up gallstones. Follow these instructions at home: Medicines Take over-the-counter and prescription medicines only as told by your doctor. If you were prescribed antibiotics, take them as told by your doctor. Do not stop taking them even if you start to feel  better. Ask your doctor if you should avoid driving or using machines while you are taking your medicine. Eating and drinking Drink enough fluid to keep your pee pale yellow. Drink water or clear fluids. This is important when you have pain. Eat healthy foods. Choose: Fewer fatty foods, such as fried foods. Fewer refined carbs. Avoid breads and grains that are highly processed, such as white bread and white rice. Choose whole grains, such as whole-wheat bread and brown rice. More fiber. Almonds, fresh fruit, and beans are healthy sources. General instructions Keep a healthy weight. Keep all follow-up visits. You may need to see a specialist or a Careers adviser. Where to find more information [employer] of Diabetes and Digestive and Kidney Diseases: [email or web address] Contact a doctor if: You have sudden pain in the upper right part of your belly. Pain might spread to your right shoulder, back, or chest. Your pain lasts more than 2 hours. You have been diagnosed with gallstones that have no symptoms and you get: Belly pain. Discomfort, burning, or fullness in the upper part of your abdomen. You keep feeling like you may vomit. You have dark pee or pale poop. Get help right away if: You have pain in your abdomen, that: Lasts more than 5 hours. Keeps getting worse. You have a fever or chills. You can't stop vomiting. Your skin or the white parts of your eyes turn yellow. This information is not intended to replace advice given to you by your health care provider. Make sure you discuss any questions you have with your health care provider. Document Revised: 12/24/2021 Document Reviewed: 12/24/2021 Elsevier Patient Education  2024 Elsevier Inc.    Edwina Barth, MD [facility]

## 2023-04-16 NOTE — Assessment & Plan Note (Signed)
Diet and nutrition discussed. Benefits of exercise discussed. The 10-year ASCVD risk score (Arnett DK, et al., 2019) is: 5.1%   Values used to calculate the score:     Age: 63 years     Sex: Female     Is Non-Hispanic African American: Yes     Diabetic: No     Tobacco smoker: No     Systolic Blood Pressure: 118 mmHg     Is BP treated: No     HDL Cholesterol: 69.9 mg/dL     Total Cholesterol: 202 mg/dL

## 2023-04-16 NOTE — Assessment & Plan Note (Signed)
Since our last discussion she was able to follow-up with surgeon who recommended surgery as an elective procedure However biliary colic episodes have become less and less frequent. Does not want to get surgery at present time Well-controlled and not affecting quality of life Diet and nutrition discussed Clinically stable.  No concerns.

## 2023-05-06 ENCOUNTER — Ambulatory Visit
Admission: RE | Admit: 2023-05-06 | Discharge: 2023-05-06 | Disposition: A | Discharge status: Other Acute Inpatient Hospital | Payer: BC Managed Care – PPO | Source: Ambulatory Visit | Attending: Emergency Medicine | Admitting: Emergency Medicine

## 2023-05-06 VITALS — BP 114/70 | HR 69 | Temp 98.1°F | Resp 18 | Ht 62.0 in | Wt 131.0 lb

## 2023-05-06 DIAGNOSIS — R1011 Right upper quadrant pain: Secondary | ICD-10-CM

## 2023-05-06 NOTE — ED Triage Notes (Signed)
"  About a year ago I had and X-ray done and needed my Gallbladder removed and I did not have this done due to Cost". "I would like another X-ray to confirm the need for this". My current symptoms include "pain in upper right side when I do too much work in upper right side of abd and pain extends to right shoulder". No fever.

## 2023-05-06 NOTE — ED Provider Notes (Signed)
Patient presents for evaluation of fatigue and RUQ pain after being instructed to have gallbladder removed in the past. She reports she had issues with affording procedure and needed more help with same. Recommended further evaluation in the ED for stat imaging as we did not have capability for same in UC setting. Patient is agreeable and will transport via POV.    Tomi Bamberger, PA-C 05/06/23 1044

## 2023-05-06 NOTE — ED Notes (Signed)
Patient is being discharged from the Urgent Care and sent to the Emergency Department via POV . Per RM, patient is in need of higher level of care due to abd pain. Patient is aware and verbalizes understanding of plan of care.  Vitals:   05/06/23 1029  BP: 114/70  Pulse: 69  Resp: 18  Temp: 98.1 F (36.7 C)  SpO2: 98%

## 2023-05-07 ENCOUNTER — Encounter (HOSPITAL_COMMUNITY): Payer: Self-pay

## 2023-05-07 ENCOUNTER — Emergency Department (HOSPITAL_COMMUNITY): Payer: BC Managed Care – PPO

## 2023-05-07 ENCOUNTER — Emergency Department (HOSPITAL_COMMUNITY)
Admission: EM | Admit: 2023-05-07 | Discharge: 2023-05-07 | Disposition: A | Discharge status: Home / Self Care | Payer: BC Managed Care – PPO | Attending: Emergency Medicine | Admitting: Emergency Medicine

## 2023-05-07 ENCOUNTER — Other Ambulatory Visit: Payer: Self-pay

## 2023-05-07 DIAGNOSIS — K8020 Calculus of gallbladder without cholecystitis without obstruction: Secondary | ICD-10-CM | POA: Insufficient documentation

## 2023-05-07 DIAGNOSIS — R1011 Right upper quadrant pain: Secondary | ICD-10-CM | POA: Diagnosis present

## 2023-05-07 LAB — COMPREHENSIVE METABOLIC PANEL
ALT: 17 U/L (ref 0–44)
AST: 24 U/L (ref 15–41)
Albumin: 3.4 g/dL — ABNORMAL LOW (ref 3.5–5.0)
Alkaline Phosphatase: 54 U/L (ref 38–126)
Anion gap: 15 (ref 5–15)
BUN: 10 mg/dL (ref 8–23)
CO2: 22 mmol/L (ref 22–32)
Calcium: 9.7 mg/dL (ref 8.9–10.3)
Chloride: 103 mmol/L (ref 98–111)
Creatinine, Ser: 0.82 mg/dL (ref 0.44–1.00)
GFR, Estimated: >60 mL/min (ref >60)
Glucose, Bld: 102 mg/dL — ABNORMAL HIGH (ref 70–99)
Potassium: 3.8 mmol/L (ref 3.5–5.1)
Sodium: 140 mmol/L (ref 135–145)
Total Bilirubin: 0.6 mg/dL (ref 0.0–1.2)
Total Protein: 7.3 g/dL (ref 6.5–8.1)

## 2023-05-07 LAB — CBC WITH DIFFERENTIAL/PLATELET
Abs Immature Granulocytes: 0.01 10*3/uL (ref 0.00–0.07)
Basophils Absolute: 0 10*3/uL (ref 0.0–0.1)
Basophils Relative: 1 %
Eosinophils Absolute: 0.3 10*3/uL (ref 0.0–0.5)
Eosinophils Relative: 4 %
HCT: 39.9 % (ref 36.0–46.0)
Hemoglobin: 13 g/dL (ref 12.0–15.0)
Immature Granulocytes: 0 %
Lymphocytes Relative: 36 %
Lymphs Abs: 2.3 10*3/uL (ref 0.7–4.0)
MCH: 30.1 pg (ref 26.0–34.0)
MCHC: 32.6 g/dL (ref 30.0–36.0)
MCV: 92.4 fL (ref 80.0–100.0)
Monocytes Absolute: 0.4 10*3/uL (ref 0.1–1.0)
Monocytes Relative: 7 %
Neutro Abs: 3.4 10*3/uL (ref 1.7–7.7)
Neutrophils Relative %: 52 %
Platelets: 282 10*3/uL (ref 150–400)
RBC: 4.32 MIL/uL (ref 3.87–5.11)
RDW: 12.4 % (ref 11.5–15.5)
WBC: 6.4 10*3/uL (ref 4.0–10.5)
nRBC: 0 % (ref 0.0–0.2)

## 2023-05-07 LAB — LIPASE, BLOOD: Lipase: 37 U/L (ref 11–51)

## 2023-05-07 NOTE — ED Triage Notes (Signed)
Pt has pain to right flank that started 3 years ago and pt had recent ct scan that showed inflamed gallbladder. Pt urinating and has regular bowel movements. Denies n/v/d/fevers.

## 2023-05-07 NOTE — Discharge Instructions (Addendum)
All the blood work today looks good but your gallbladder is getting worse which is what is causing your pain.  You would most likely feel much better if they took your gallbladder out.  If you start having fever, vomiting, severe pain that does not improve return to the emergency room.

## 2023-05-07 NOTE — ED Provider Notes (Signed)
[place] EMERGENCY DEPARTMENT AT [facility] Provider Note   CSN: [id number] Arrival date & time: 05/07/23  0930     History  Chief Complaint  Patient presents with   Flank Pain    Teresa Morrison is a 63 y.o. female.  Patient is a 63 year old female with a history of cholelithiasis and allergies who is presenting today with ongoing issues with right upper quadrant pain.  She reports this pain has been intermittent for the last 5 years but has become more uncomfortable recently.  She reports she gets it after eating and it does wake her up at night.  She also does notice it when she is doing things as well.  She has not had any nausea vomiting or diarrhea and denies any fevers.  She has no urinary complaints.  She denies any cough or congestion.  She reports at the time she was referred to [facility] surgery but that time she did not want surgery.  The history is provided by the patient.  Flank Pain       Home Medications Prior to Admission medications   Medication Sig Start Date End Date Taking? Authorizing Provider  pantoprazole (PROTONIX) 40 MG tablet Take 1 tablet (40 mg total) by mouth daily. Patient not taking: Reported on 04/16/2023 04/17/20   Janeece Agee, NP      Allergies    Patient has no known allergies.    Review of Systems   Review of Systems  Genitourinary:  Positive for flank pain.    Physical Exam Updated Vital Signs BP 123/69 (BP Location: Right Arm)   Pulse 87   Temp 98.6 F (37 C) (Oral)   Resp 18   Ht 5\' 2"  (1.575 m)   Wt 60.8 kg   SpO2 100%   BMI 24.51 kg/m  Physical Exam Vitals and nursing note reviewed.  Constitutional:      General: She is not in acute distress.    Appearance: She is well-developed.  HENT:     Head: Normocephalic and atraumatic.  Eyes:     Pupils: Pupils are equal, round, and reactive to light.  Cardiovascular:     Rate and Rhythm: Normal rate and regular rhythm.     Heart sounds: Normal  heart sounds. No murmur heard.    No friction rub.  Pulmonary:     Effort: Pulmonary effort is normal.     Breath sounds: Normal breath sounds. No wheezing or rales.  Abdominal:     General: Bowel sounds are normal. There is no distension.     Palpations: Abdomen is soft.     Tenderness: There is abdominal tenderness in the right upper quadrant. There is no right CVA tenderness, guarding or rebound.  Musculoskeletal:        General: No tenderness. Normal range of motion.     Comments: No edema  Skin:    General: Skin is warm and dry.     Findings: No rash.  Neurological:     Mental Status: She is alert and oriented to person, place, and time.     Cranial Nerves: No cranial nerve deficit.  Psychiatric:        Behavior: Behavior normal.     ED Results / Procedures / Treatments   Labs (all labs ordered are listed, but only abnormal results are displayed) Labs Reviewed  COMPREHENSIVE METABOLIC PANEL - Abnormal; Notable for the following components:      Result Value   Glucose, Bld 102 (*)  Albumin 3.4 (*)    All other components within normal limits  CBC WITH DIFFERENTIAL/PLATELET  LIPASE, BLOOD    EKG None  Radiology US Abdomen Limited RUQ (LIVER/GB) Result Date: 05/07/2023 CLINICAL DATA:  Right upper quadrant pain for a year EXAM: ULTRASOUND ABDOMEN LIMITED RIGHT UPPER QUADRANT COMPARISON:  Ultrasound 05/13/2022 FINDINGS: Gallbladder: Distended gallbladder with layering stones. No wall thickening or adjacent fluid. No reported sonographic Murphy's sign. Common bile duct: Diameter: 3 mm Liver: No focal lesion identified. Within normal limits in parenchymal echogenicity. Portal vein is patent on color Doppler imaging with normal direction of blood flow towards the liver. Other: None. IMPRESSION: Distended gallbladder with multiple stones. No further sonographic evidence of acute cholecystitis. No biliary ductal dilatation. Electronically Signed   By: Karen Kays M.D.   On:  05/07/2023 12:20    Procedures Procedures    Medications Ordered in ED Medications - No data to display  ED Course/ Medical Decision Making/ A&P                                 Medical Decision Making Amount and/or Complexity of Data Reviewed Labs: ordered. Decision-making details documented in ED Course. Radiology: ordered and independent interpretation performed. Decision-making details documented in ED Course.   Patient presenting today with recurrent right upper quadrant pain that is getting worse over time.  She was evaluated approximately 1 year ago and at that time had a right upper quadrant ultrasound that showed cholelithiasis but no evidence of duct obstruction.  She continues to have pain but denies any infectious symptoms.  Concern for biliary colic but will rule out evidence of obstruction get a new ultrasound and feel ultimately the patient will need referral to [facility] surgery.  She does not complain of any symptoms suggestive of renal stone, no hepatomegaly or lower abdominal pain.  She has no peritoneal signs and low suspicion for perforation.  Also she denies any urinary symptoms concerning that this could be pyelonephritis.  Also low suspicion for acute lung pathology.  1:41 PM I independently interpreted patient's labs and CBC, CMP, lipase are all within normal limits.  I have independently visualized and interpreted pt's images today.  ReSound showing numerous gallstones.  Radiology reports distended gallbladder with multiple stones but no further abnormalities and normal biliary duct size.  This was discussed with the patient and at this time feel that she is stable for discharge for outpatient surgical consultation and cholecystectomy.         Final Clinical Impression(s) / ED Diagnoses Final diagnoses:  Calculus of gallbladder without cholecystitis without obstruction    Rx / DC Orders ED Discharge Orders     None         Gwyneth Sprout, MD 05/07/23 1342

## 2024-01-06 ENCOUNTER — Ambulatory Visit: Payer: Self-pay

## 2024-01-06 NOTE — Telephone Encounter (Signed)
 FYI Only or Action Required?: FYI only for provider.  Patient was last seen in primary care on 04/16/2023 by Purcell Emil Schanz, MD.  Called Nurse Triage reporting Pain.  Symptoms began x 2 weeks ago.  Interventions attempted: Nothing.  Symptoms are: unchanged.  Triage Disposition: See PCP When Office is Open (Within 3 Days)  Patient/caregiver understands and will follow disposition?: Yes    Copied from CRM [id number]. Topic: Clinical - Red Word Triage >> Jan 06, 2024  9:21 AM Robinson H wrote: [facility] that prompted transfer to Nurse Triage: Surgery last march on right side having pain and burning in area Reason for Disposition  [1] MODERATE pain (e.g., interferes with normal activities) AND [2] present > 3 days  Answer Assessment - Initial Assessment Questions 1. ONSET: When did the muscle aches or body pains start?      X  2 weeks 2. LOCATION: What part of your body is hurting? (e.g., entire body, arms, legs)      Surgery area located on right side has pain and burning 3. SEVERITY: How bad is the pain? (Scale 1-10; or mild, moderate, severe)     4/10 pain and comes and goes 4. CAUSE: What do you think is causing the pains?     unknown 5. FEVER: Do you have a fever? If Yes, ask: What is your temperature, how was it measured, and  when did it start?      no 6. OTHER SYMPTOMS: Do you have any other symptoms? (e.g., chest pain, cold or flu symptoms, rash, weakness, weight loss)     no 7. PREGNANCY: Is there any chance you are pregnant? When was your last menstrual period?     na 8. TRAVEL: Have you traveled out of the country in the last month? (e.g., exposures, travel history)     Na  Surgery last march.  No c/o of redness, no warmth, no drainage to surgical site or fever noted for pt  Protocols used: Muscle Aches and Body Pain-A-AH

## 2024-01-06 NOTE — Telephone Encounter (Signed)
 Pt has appt scheduled.

## 2024-01-08 ENCOUNTER — Ambulatory Visit: Admitting: Emergency Medicine

## 2024-01-08 ENCOUNTER — Encounter: Payer: Self-pay | Admitting: Emergency Medicine

## 2024-01-08 ENCOUNTER — Ambulatory Visit: Payer: Self-pay | Admitting: Emergency Medicine

## 2024-01-08 VITALS — BP 118/68 | HR 77 | Temp 99.1°F | Ht 62.0 in | Wt 130.0 lb

## 2024-01-08 DIAGNOSIS — R1011 Right upper quadrant pain: Secondary | ICD-10-CM

## 2024-01-08 LAB — CBC WITH DIFFERENTIAL/PLATELET
Basophils Absolute: 0.1 K/uL (ref 0.0–0.1)
Basophils Relative: 0.9 % (ref 0.0–3.0)
Eosinophils Absolute: 0.3 K/uL (ref 0.0–0.7)
Eosinophils Relative: 5.3 % — ABNORMAL HIGH (ref 0.0–5.0)
HCT: 39.3 % (ref 36.0–46.0)
Hemoglobin: 12.9 g/dL (ref 12.0–15.0)
Lymphocytes Relative: 38.1 % (ref 12.0–46.0)
Lymphs Abs: 2.3 K/uL (ref 0.7–4.0)
MCHC: 32.8 g/dL (ref 30.0–36.0)
MCV: 91.7 fl (ref 78.0–100.0)
Monocytes Absolute: 0.5 K/uL (ref 0.1–1.0)
Monocytes Relative: 9.1 % (ref 3.0–12.0)
Neutro Abs: 2.8 K/uL (ref 1.4–7.7)
Neutrophils Relative %: 46.6 % (ref 43.0–77.0)
Platelets: 301 K/uL (ref 150.0–400.0)
RBC: 4.28 Mil/uL (ref 3.87–5.11)
RDW: 13.1 % (ref 11.5–15.5)
WBC: 6 K/uL (ref 4.0–10.5)

## 2024-01-08 LAB — COMPREHENSIVE METABOLIC PANEL WITH GFR
ALT: 17 U/L (ref 0–35)
AST: 23 U/L (ref 0–37)
Albumin: 4.1 g/dL (ref 3.5–5.2)
Alkaline Phosphatase: 79 U/L (ref 39–117)
BUN: 12 mg/dL (ref 6–23)
CO2: 27 meq/L (ref 19–32)
Calcium: 9.2 mg/dL (ref 8.4–10.5)
Chloride: 106 meq/L (ref 96–112)
Creatinine, Ser: 0.73 mg/dL (ref 0.40–1.20)
GFR: 87.23 mL/min (ref >60.00)
Glucose, Bld: 77 mg/dL (ref 70–99)
Potassium: 4.2 meq/L (ref 3.5–5.1)
Sodium: 140 meq/L (ref 135–145)
Total Bilirubin: 0.3 mg/dL (ref 0.2–1.2)
Total Protein: 7.8 g/dL (ref 6.0–8.3)

## 2024-01-08 LAB — URINALYSIS
Bilirubin Urine: NEGATIVE
Hgb urine dipstick: NEGATIVE
Ketones, ur: NEGATIVE
Leukocytes,Ua: NEGATIVE
Nitrite: NEGATIVE
Specific Gravity, Urine: 1.025 (ref 1.000–1.030)
Total Protein, Urine: NEGATIVE
Urine Glucose: NEGATIVE
Urobilinogen, UA: 0.2 (ref 0.0–1.0)
pH: 6 (ref 5.0–8.0)

## 2024-01-08 LAB — LIPASE: Lipase: 34 U/L (ref 11.0–59.0)

## 2024-01-08 NOTE — Progress Notes (Signed)
 Teresa Morrison 63 y.o.   Chief Complaint  Patient presents with   Abdominal Pain    Patient is having right anterior abdominal pain for about 2 weeks. She states its a burning feeling, she says she is having her gallbladder removed March 2026. She is not taking anything for the pain, no urinary symptoms     HISTORY OF PRESENT ILLNESS: This is a 63 y.o. female complaining of right upper abdominal pain for the last 2 weeks.  Intermittent sharp pain lasting several minutes without associated symptoms.  Unknown triggers. Had gallbladder removed last March 2025. No other complaints or medical concerns today.  Abdominal Pain Pertinent negatives include no diarrhea, dysuria, fever, headaches, hematuria, melena, nausea or vomiting.     Prior to Admission medications   Medication Sig Start Date End Date Taking? Authorizing Provider  pantoprazole  (PROTONIX ) 40 MG tablet Take 1 tablet (40 mg total) by mouth daily. 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  Social History   Socioeconomic History   Marital status: Married    Spouse name: Not on file   Number of children: 5   Years of education: Not on file   Highest education level: Not on file  Occupational History   Occupation: CNA  Tobacco Use   Smoking status: Never   Smokeless tobacco: Never  Vaping Use   Vaping status: Never Used  Substance and Sexual Activity   Alcohol use: No   Drug use: No   Sexual activity: Not Currently    Birth control/protection: None  Other Topics Concern   Not on file  Social History Narrative   Not on file   Social  Drivers of Health   Financial Resource Strain: Not on file  Food Insecurity: Not on file  Transportation Needs: Not on file  Physical Activity: Not on file  Stress: Not on file  Social Connections: Not on file  Intimate Partner Violence: Not on file    No family history on file.   Review of Systems  Constitutional: Negative.  Negative for chills and fever.  HENT: Negative.  Negative for congestion and sore throat.   Respiratory: Negative.  Negative for cough and shortness of breath.   Cardiovascular: Negative.  Negative for chest pain and palpitations.  Gastrointestinal:  Positive for abdominal pain. Negative for blood in stool, diarrhea, melena, nausea and vomiting.  Genitourinary: Negative.  Negative for dysuria and hematuria.  Skin: Negative.  Negative for rash.  Neurological: Negative.  Negative for dizziness and headaches.  All other systems reviewed and are negative.   Vitals:   01/08/24 1025  BP: 118/68  Pulse: 77  Temp: 99.1 F (37.3 C)  SpO2: 98%    Physical Exam Vitals reviewed.  Constitutional:      Appearance: She is well-developed.  HENT:     Head: Normocephalic.     Mouth/Throat:     Mouth: Mucous membranes are moist.     Pharynx: Oropharynx is clear.  Eyes:     Extraocular Movements: Extraocular movements intact.     Pupils: Pupils are equal, round, and reactive to light.  Cardiovascular:     Rate and Rhythm: Normal rate and regular rhythm.     Pulses: Normal pulses.     Heart sounds: Normal heart sounds.  Pulmonary:     Effort: Pulmonary effort is normal.     Breath sounds: Normal breath sounds.  Abdominal:     Palpations: Abdomen is soft.     Tenderness: There is no abdominal tenderness.  Musculoskeletal:     Cervical back: No tenderness.  Lymphadenopathy:     Cervical: No cervical adenopathy.  Skin:    General: Skin is warm and dry.     Capillary Refill: Capillary refill takes less than 2 seconds.  Neurological:     General: No focal  deficit present.     Mental Status: She is alert and oriented to person, place, and time.  Psychiatric:        Mood and Affect: Mood normal.        Behavior: Behavior normal.      ASSESSMENT & PLAN: Problem List Items Addressed This Visit       Other   Right upper quadrant abdominal pain - Primary   Of 2 weeks duration.  Clinically stable.  No red flag signs or symptoms. Benign abdominal exam.  Afebrile. Status post lap cholecystectomy March 2025 Differential diagnosis discussed including possibility of retained common bile duct stone. Pain management discussed. Diet and nutrition discussed Recommend blood work today and urinalysis Recommend right upper quadrant ultrasound for further definition We will follow-up after that.      Relevant Orders   Lipase   Comprehensive metabolic panel with GFR   CBC with Differential/Platelet   Urinalysis   US  Abdomen Limited RUQ (LIVER/GB)     Patient Instructions  Abdominal Pain, Adult  Many things can cause belly (abdominal) pain. In most cases, belly pain is not a serious problem and can be watched and treated at home. But in some cases, it can be serious. Your doctor will try to find the cause of your belly pain. Follow these instructions at home: Medicines Take over-the-counter and prescription medicines only as told by your doctor. Do not take medicines that help you poop (laxatives) unless told by your doctor. General instructions Watch your belly pain for any changes. Tell your doctor if the pain gets worse. Drink enough fluid to keep your pee (urine) pale yellow. Contact a doctor if: Your belly pain changes or gets worse. You have very bad cramping or bloating in your belly. You vomit. Your pain gets worse with meals, after eating, or with certain foods. You have trouble pooping or have watery poop for more than 2-3 days. You are not hungry, or you lose weight without trying. You have signs of not getting enough fluid  or water (dehydration). These may include: Dark pee, very little pee, or no pee. Cracked lips or dry mouth. Feeling sleepy or weak. You have pain when you pee or poop. Your belly pain wakes you up at night. You have blood in your pee. You have a fever. Get help right away if: You cannot stop vomiting. Your pain is only in one part of your belly, like on the right side. You have bloody or black poop, or poop that looks like tar. You have trouble breathing. You have chest pain. These symptoms may be an emergency. Get help right away. Call 911. Do not wait to see if the symptoms will go away. Do not drive yourself to the hospital. This information is not intended  to replace advice given to you by your health care provider. Make sure you discuss any questions you have with your health care provider. Document Revised: 12/26/2021 Document Reviewed: 12/26/2021 Elsevier Patient Education  2024 Elsevier Inc.    Emil Schaumann, MD [facility]

## 2024-01-08 NOTE — Patient Instructions (Signed)
 Abdominal Pain, Adult    Many things can cause belly (abdominal) pain. In most cases, belly pain is not a serious problem and can be watched and treated at home. But in some cases, it can be serious.  Your doctor will try to find the cause of your belly pain.  Follow these instructions at home:  Medicines  Take over-the-counter and prescription medicines only as told by your doctor.  Do not take medicines that help you poop (laxatives) unless told by your doctor.  General instructions  Watch your belly pain for any changes. Tell your doctor if the pain gets worse.  Drink enough fluid to keep your pee (urine) pale yellow.  Contact a doctor if:  Your belly pain changes or gets worse.  You have very bad cramping or bloating in your belly.  You vomit.  Your pain gets worse with meals, after eating, or with certain foods.  You have trouble pooping or have watery poop for more than 2-3 days.  You are not hungry, or you lose weight without trying.  You have signs of not getting enough fluid or water (dehydration). These may include:  Dark pee, very Chastain pee, or no pee.  Cracked lips or dry mouth.  Feeling sleepy or weak.  You have pain when you pee or poop.  Your belly pain wakes you up at night.  You have blood in your pee.  You have a fever.  Get help right away if:  You cannot stop vomiting.  Your pain is only in one part of your belly, like on the right side.  You have bloody or black poop, or poop that looks like tar.  You have trouble breathing.  You have chest pain.  These symptoms may be an emergency. Get help right away. Call 911.  Do not wait to see if the symptoms will go away.  Do not drive yourself to the hospital.  This information is not intended to replace advice given to you by your health care provider. Make sure you discuss any questions you have with your health care provider.  Document Revised: 12/26/2021 Document Reviewed: 12/26/2021  Elsevier Patient Education  2024 [employer].

## 2024-01-08 NOTE — Assessment & Plan Note (Addendum)
 Of 2 weeks duration.  Clinically stable.  No red flag signs or symptoms. Benign abdominal exam.  Afebrile. Status post lap cholecystectomy March 2025 Differential diagnosis discussed including possibility of retained common bile duct stone. Pain management discussed. Diet and nutrition discussed Recommend blood work today and urinalysis Recommend right upper quadrant ultrasound for further definition We will follow-up after that.

## 2024-01-13 ENCOUNTER — Ambulatory Visit
Admission: RE | Admit: 2024-01-13 | Discharge: 2024-01-13 | Disposition: A | Discharge status: Home / Self Care | Source: Ambulatory Visit | Attending: Emergency Medicine | Admitting: Emergency Medicine

## 2024-01-13 DIAGNOSIS — R1011 Right upper quadrant pain: Secondary | ICD-10-CM

## 2024-03-29 ENCOUNTER — Ambulatory Visit: Payer: Self-pay

## 2024-03-29 ENCOUNTER — Telehealth: Payer: Self-pay

## 2024-03-29 DIAGNOSIS — K0889 Other specified disorders of teeth and supporting structures: Secondary | ICD-10-CM

## 2024-03-29 NOTE — Addendum Note (Signed)
 Addended by: ROSALVA LEX RAMAN on: 03/29/2024 03:45 PM   Modules accepted: Orders

## 2024-03-29 NOTE — Telephone Encounter (Signed)
 Okay to refer to dentist

## 2024-03-29 NOTE — Telephone Encounter (Signed)
 Copied from CRM [id number]. Topic: Referral - Question >> Mar 29, 2024 10:55 AM Treva T wrote: Reason for CRM: Pt calling states she would like a referral to another dentist , and if this is something primary care can assist with.  Pt reports previous dentist, left the country, and she Is needing a referral to a new dentist.  Reason for referral: Dental xrays for dental treatment, for loose teeth  Pt call back [phone]  Pt is aware of call back.

## 2024-03-29 NOTE — Telephone Encounter (Signed)
 FYI Only or Action Required?: FYI only for provider: appointment scheduled on 04/07/24.  Patient was last seen in primary care on 01/08/2024 by Purcell Emil Schanz, MD.  Called Nurse Triage reporting Back Pain.  Symptoms began several weeks ago.  Interventions attempted: Nothing.  Symptoms are: unchanged.  Triage Disposition: See PCP Within 2 Weeks  Patient/caregiver understands and will follow disposition?: Yes  Reason for Disposition  Back pain present > 2 weeks  Answer Assessment - Initial Assessment Questions Pt calling to report Lower back pain, pain 8/10. Pt calling to schedule appt with PCP.   Pt scheduled 04/07/24 with PCP.   1. ONSET: When did the pain begin? (e.g., minutes, hours, days)     Ongoing  2. LOCATION: Where does it hurt? (upper, mid or lower back)     Low back  3. SEVERITY: How bad is the pain?  (e.g., Scale 1-10; mild, moderate, or severe)     8/10 4. PATTERN: Is the pain constant? (e.g., yes, no; constant, intermittent)      Comes and  goes  5. RADIATION: Does the pain shoot into your legs or somewhere else?     Denies  6. CAUSE:  What do you think is causing the back pain?      Working too much  7. BACK OVERUSE:  Any recent lifting of heavy objects, strenuous work or exercise?     Sometimes  8. MEDICINES: What have you taken so far for the pain? (e.g., nothing, acetaminophen, NSAIDS)     Denies  9. NEUROLOGIC SYMPTOMS: Do you have any weakness, numbness, or problems with bowel/bladder control?     Denies  10. OTHER SYMPTOMS: Do you have any other symptoms? (e.g., fever, abdomen pain, burning with urination, blood in urine)       Denies  Protocols used: Back Pain-A-AH   Copied from CRM #[id number]. Topic: Clinical - Red Word Triage >> Mar 29, 2024 10:58 AM Treva T wrote: Red Word that prompted transfer to Nurse Triage: Pt is calling states she is having lower back pain, that is worsening with walking.  Pt reported no known  injury.  Pt requesting an appt for evaluation.   CB#, [phone]

## 2024-03-29 NOTE — Telephone Encounter (Signed)
 Referral has been placed.

## 2024-03-29 NOTE — Telephone Encounter (Signed)
 Please advise

## 2024-04-07 ENCOUNTER — Ambulatory Visit (INDEPENDENT_AMBULATORY_CARE_PROVIDER_SITE_OTHER)

## 2024-04-07 ENCOUNTER — Ambulatory Visit: Payer: Self-pay | Admitting: Emergency Medicine

## 2024-04-07 ENCOUNTER — Encounter: Payer: Self-pay | Admitting: Emergency Medicine

## 2024-04-07 ENCOUNTER — Ambulatory Visit: Admitting: Emergency Medicine

## 2024-04-07 VITALS — BP 116/80 | HR 77 | Temp 97.9°F | Ht 62.0 in | Wt 130.0 lb

## 2024-04-07 DIAGNOSIS — S39012A Strain of muscle, fascia and tendon of lower back, initial encounter: Secondary | ICD-10-CM | POA: Diagnosis not present

## 2024-04-07 DIAGNOSIS — G8929 Other chronic pain: Secondary | ICD-10-CM

## 2024-04-07 DIAGNOSIS — K089 Disorder of teeth and supporting structures, unspecified: Secondary | ICD-10-CM | POA: Diagnosis not present

## 2024-04-07 DIAGNOSIS — M545 Low back pain, unspecified: Secondary | ICD-10-CM

## 2024-04-07 MED ORDER — TRAMADOL HCL 50 MG PO TABS: 50.0000 mg | ORAL_TABLET | Freq: Three times a day (TID) | ORAL | 1 refills | Status: AC | PRN

## 2024-04-07 NOTE — Progress Notes (Signed)
 Teresa Morrison 64 y.o.   Chief Complaint  Patient presents with   Back Pain    Pt states that this has been going on for about 5-6 months now, pt would like a referral to a dentist     HISTORY OF PRESENT ILLNESS: This is a 64 y.o. female complaining of lumbar pain for couple of weeks Steady pain without radiation.  Not associated with any other significant symptoms. Also needs referral for a dentist for evaluation of chronic dental pain No other complaints or medical concerns today.  HPI   Prior to Admission medications  Medication Sig Start Date End Date Taking? Authorizing Provider  traMADol  (ULTRAM ) 50 MG tablet Take 1 tablet (50 mg total) by mouth every 8 (eight) hours as needed for up to 5 days. 04/07/24 04/12/24 Yes Kenzlie Disch, Emil Schanz, MD  pantoprazole  (PROTONIX ) 40 MG tablet Take 1 tablet (40 mg total) by mouth daily. 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 No pertinent surgical history.  Social History   Socioeconomic History   Marital status: Married    Spouse name: Not on file   Number of children: 5   Years of education: Not on file   Highest education level: Not on file  Occupational History   Occupation: CNA  Tobacco Use   Smoking status: Never   Smokeless tobacco: Never  Vaping Use   Vaping status: Never Used  Substance and Sexual Activity   Alcohol use: No   Drug use: No   Sexual activity: Not Currently    Birth control/protection: None  Other Topics Concern   Not on file  Social History Narrative   Not on file   Social Drivers of Health   Tobacco Use: Low Risk (04/07/2024)   Patient  History    Smoking Tobacco Use: Never    Smokeless Tobacco Use: Never    Passive Exposure: Not on file  Financial Resource Strain: Not on file  Food Insecurity: Not on file  Transportation Needs: Not on file  Physical Activity: Not on file  Stress: Not on file  Social Connections: Not on file  Intimate Partner Violence: Not on file  Depression (PHQ2-9): Low Risk (01/08/2024)   Depression (PHQ2-9)    PHQ-2 Score: 0  Alcohol Screen: Not on file  Housing: Unknown (06/25/2023)   Received from [facility]   Epic    Unable to Pay for Housing in the Last Year: Not on file    Number of Times Moved in the Last Year: Not on file    At any time in the past 12 months, were you homeless or living in a shelter (including now)?: No  Utilities: Not on file  Health Literacy: Not on file    History reviewed. No pertinent family history.   Review of Systems  Constitutional: Negative.  Negative for chills and fever.  HENT: Negative.  Negative for congestion and sore throat.   Respiratory: Negative.  Negative for cough and shortness of breath.   Cardiovascular: Negative.  Negative for chest pain and palpitations.  Gastrointestinal:  Negative for abdominal pain, diarrhea, nausea and vomiting.  Genitourinary: Negative.  Negative for dysuria and hematuria.  Musculoskeletal:  Positive for back pain.  Skin: Negative.  Negative for rash.  Neurological: Negative.  Negative for dizziness and headaches.  All other systems reviewed and are negative.   Vitals:   04/07/24 1535  BP: 116/80  Pulse: 77  Temp: 97.9 F (36.6 C)  SpO2: 98%    Physical Exam Vitals reviewed.  Constitutional:      Appearance: Normal appearance.  HENT:     Head: Normocephalic.     Mouth/Throat:     Mouth: Mucous membranes are moist.     Pharynx: Oropharynx is clear.  Eyes:     Extraocular Movements: Extraocular movements intact.     Pupils: Pupils are equal, round, and reactive to light.   Cardiovascular:     Rate and Rhythm: Normal rate and regular rhythm.     Pulses: Normal pulses.     Heart sounds: Normal heart sounds.  Pulmonary:     Effort: Pulmonary effort is normal.     Breath sounds: Normal breath sounds.  Abdominal:     Palpations: Abdomen is soft.     Tenderness: There is no abdominal tenderness.  Musculoskeletal:     Cervical back: No tenderness.     Lumbar back: Tenderness present. No bony tenderness. Decreased range of motion.  Lymphadenopathy:     Cervical: No cervical adenopathy.  Skin:    General: Skin is warm and dry.     Capillary Refill: Capillary refill takes less than 2 seconds.  Neurological:     General: No focal deficit present.     Mental Status: She is alert and oriented to person, place, and time.  Psychiatric:        Mood and Affect: Mood normal.        Behavior: Behavior normal.    DG Lumbar Spine 2-3 Views Result Date: 04/07/2024 CLINICAL DATA:  Low back pain for 3 months EXAM: LUMBAR SPINE - 2-3 VIEW COMPARISON:  10/02/2022 FINDINGS: Frontal and lateral views of the lumbar spine are obtained on 3 images. There are 5 non-rib-bearing lumbar type vertebral bodies in grossly normal anatomic alignment. No acute fractures. Disc spaces are relatively well preserved. Mild facet hypertrophy at the lumbosacral junction. Sacroiliac joints are unremarkable. IMPRESSION: 1. No acute lumbar spine fracture. 2. Mild lower lumbar facet hypertrophy. Electronically Signed   By: Ozell Daring M.D.   On: 04/07/2024 16:01     ASSESSMENT & PLAN: Problem List Items Addressed This Visit       Musculoskeletal and Integument   Lumbar strain, initial encounter   Clinically stable.  No red flag signs or symptoms. Benign physical examination Pain management discussed Recommend x-rays today.  Will review images when available May need physical therapy      Relevant Orders   DG Lumbar Spine 2-3 Views (Completed)     Other   Lumbar pain - Primary    Mechanical in nature.  Clinically stable. No red flag signs or symptoms. Differential diagnosis discussed X-rays done today.  Report reviewed. Pain management discussed Recommend Tylenol for mild to moderate pain and tramadol  for moderate to severe pain May need orthopedic evaluation.      Relevant Medications   traMADol  (ULTRAM ) 50 MG tablet   Other Relevant Orders   DG Lumbar Spine 2-3 Views (Completed)   Other Visit Diagnoses       Chronic dental pain       Relevant Medications   traMADol  (ULTRAM ) 50 MG tablet  Other Relevant Orders   Ambulatory referral to Dentistry      Patient Instructions  Acute Back Pain, Adult Acute back pain is sudden and usually short-lived. It is often caused by an injury to the muscles and tissues in the back. The injury may result from: A muscle, tendon, or ligament getting overstretched or torn. Ligaments are tissues that connect bones to each other. Lifting something improperly can cause a back strain. Wear and tear (degeneration) of the spinal disks. Spinal disks are circular tissue that provide cushioning between the bones of the spine (vertebrae). Twisting motions, such as while playing sports or doing yard work. A hit to the back. Arthritis. You may have a physical exam, lab tests, and imaging tests to find the cause of your pain. Acute back pain usually goes away with rest and home care. Follow these instructions at home: Managing pain, stiffness, and swelling Take over-the-counter and prescription medicines only as told by your health care provider. Treatment may include medicines for pain and inflammation that are taken by mouth or applied to the skin, or muscle relaxants. Your health care provider may recommend applying ice during the first 24-48 hours after your pain starts. To do this: Put ice in a plastic bag. Place a towel between your skin and the bag. Leave the ice on for 20 minutes, 2-3 times a day. Remove the ice if your skin  turns bright red. This is very important. If you cannot feel pain, heat, or cold, you have a greater risk of damage to the area. If directed, apply heat to the affected area as often as told by your health care provider. Use the heat source that your health care provider recommends, such as a moist heat pack or a heating pad. Place a towel between your skin and the heat source. Leave the heat on for 20-30 minutes. Remove the heat if your skin turns bright red. This is especially important if you are unable to feel pain, heat, or cold. You have a greater risk of getting burned. Activity  Do not stay in bed. Staying in bed for more than 1-2 days can delay your recovery. Sit up and stand up straight. Avoid leaning forward when you sit or hunching over when you stand. If you work at a desk, sit close to it so you do not need to lean over. Keep your chin tucked in. Keep your neck drawn back, and keep your elbows bent at a 90-degree angle (right angle). Sit high and close to the steering wheel when you drive. Add lower back (lumbar) support to your car seat, if needed. Take short walks on even surfaces as soon as you are able. Try to increase the length of time you walk each day. Do not sit, drive, or stand in one place for more than 30 minutes at a time. Sitting or standing for long periods of time can put stress on your back. Do not drive or use heavy machinery while taking prescription pain medicine. Use proper lifting techniques. When you bend and lift, use positions that put less stress on your back: Ay[place] your knees. Keep the load close to your body. Avoid twisting. Exercise regularly as told by your health care provider. Exercising helps your back heal faster and helps prevent back injuries by keeping muscles strong and flexible. Work with a physical therapist to make a safe exercise program, as recommended by your health care provider. Do any exercises as told by your physical  therapist. Lifestyle  Maintain a healthy weight. Extra weight puts stress on your back and makes it difficult to have good posture. Avoid activities or situations that make you feel anxious or stressed. Stress and anxiety increase muscle tension and can make back pain worse. Learn ways to manage anxiety and stress, such as through exercise. General instructions Sleep on a firm mattress in a comfortable position. Try lying on your side with your knees slightly bent. If you lie on your back, put a pillow under your knees. Keep your head and neck in a straight line with your spine (neutral position) when using electronic equipment like smartphones or pads. To do this: Raise your smartphone or pad to look at it instead of bending your head or neck to look down. Put the smartphone or pad at the level of your face while looking at the screen. Follow your treatment plan as told by your health care provider. This may include: Cognitive or behavioral therapy. Acupuncture or massage therapy. Meditation or yoga. Contact a health care provider if: You have pain that is not relieved with rest or medicine. You have increasing pain going down into your legs or buttocks. Your pain does not improve after 2 weeks. You have pain at night. You lose weight without trying. You have a fever or chills. You develop nausea or vomiting. You develop abdominal pain. Get help right away if: You develop new bowel or bladder control problems. You have unusual weakness or numbness in your arms or legs. You feel faint. These symptoms may represent a serious problem that is an emergency. Do not wait to see if the symptoms will go away. Get medical help right away. Call your local emergency services (911 in the [place] Do not drive yourself to the hospital. Summary Acute back pain is sudden and usually short-lived. Use proper lifting techniques. When you bend and lift, use positions that put less stress on your back. Take  over-the-counter and prescription medicines only as told by your health care provider, and apply heat or ice as told. This information is not intended to replace advice given to you by your health care provider. Make sure you discuss any questions you have with your health care provider. Document Revised: 06/02/2020 Document Reviewed: 06/02/2020 Elsevier Patient Education  2024 [employer]     Emil Schaumann, MD [facility]     [1] No Known Allergies

## 2024-04-07 NOTE — Assessment & Plan Note (Signed)
 Clinically stable.  No red flag signs or symptoms. Benign physical examination Pain management discussed Recommend x-rays today.  Will review images when available May need physical therapy

## 2024-04-07 NOTE — Progress Notes (Signed)
 Spoke with patient in regards to this and was able to go over the results she verbalized she understood

## 2024-04-07 NOTE — Assessment & Plan Note (Signed)
 Mechanical in nature.  Clinically stable. No red flag signs or symptoms. Differential diagnosis discussed X-rays done today.  Report reviewed. Pain management discussed Recommend Tylenol for mild to moderate pain and tramadol  for moderate to severe pain May need orthopedic evaluation.

## 2024-04-07 NOTE — Patient Instructions (Signed)
 Acute Back Pain, Adult Acute back pain is sudden and usually short-lived. It is often caused by an injury to the muscles and tissues in the back. The injury may result from: A muscle, tendon, or ligament getting overstretched or torn. Ligaments are tissues that connect bones to each other. Lifting something improperly can cause a back strain. Wear and tear (degeneration) of the spinal disks. Spinal disks are circular tissue that provide cushioning between the bones of the spine (vertebrae). Twisting motions, such as while playing sports or doing yard work. A hit to the back. Arthritis. You may have a physical exam, lab tests, and imaging tests to find the cause of your pain. Acute back pain usually goes away with rest and home care. Follow these instructions at home: Managing pain, stiffness, and swelling Take over-the-counter and prescription medicines only as told by your health care provider. Treatment may include medicines for pain and inflammation that are taken by mouth or applied to the skin, or muscle relaxants. Your health care provider may recommend applying ice during the first 24-48 hours after your pain starts. To do this: Put ice in a plastic bag. Place a towel between your skin and the bag. Leave the ice on for 20 minutes, 2-3 times a day. Remove the ice if your skin turns bright red. This is very important. If you cannot feel pain, heat, or cold, you have a greater risk of damage to the area. If directed, apply heat to the affected area as often as told by your health care provider. Use the heat source that your health care provider recommends, such as a moist heat pack or a heating pad. Place a towel between your skin and the heat source. Leave the heat on for 20-30 minutes. Remove the heat if your skin turns bright red. This is especially important if you are unable to feel pain, heat, or cold. You have a greater risk of getting burned. Activity  Do not stay in bed. Staying in  bed for more than 1-2 days can delay your recovery. Sit up and stand up straight. Avoid leaning forward when you sit or hunching over when you stand. If you work at a desk, sit close to it so you do not need to lean over. Keep your chin tucked in. Keep your neck drawn back, and keep your elbows bent at a 90-degree angle (right angle). Sit high and close to the steering wheel when you drive. Add lower back (lumbar) support to your car seat, if needed. Take short walks on even surfaces as soon as you are able. Try to increase the length of time you walk each day. Do not sit, drive, or stand in one place for more than 30 minutes at a time. Sitting or standing for long periods of time can put stress on your back. Do not drive or use heavy machinery while taking prescription pain medicine. Use proper lifting techniques. When you bend and lift, use positions that put less stress on your back: [place] your knees. Keep the load close to your body. Avoid twisting. Exercise regularly as told by your health care provider. Exercising helps your back heal faster and helps prevent back injuries by keeping muscles strong and flexible. Work with a physical therapist to make a safe exercise program, as recommended by your health care provider. Do any exercises as told by your physical therapist. Lifestyle Maintain a healthy weight. Extra weight puts stress on your back and makes it difficult to have good  posture. Avoid activities or situations that make you feel anxious or stressed. Stress and anxiety increase muscle tension and can make back pain worse. Learn ways to manage anxiety and stress, such as through exercise. General instructions Sleep on a firm mattress in a comfortable position. Try lying on your side with your knees slightly bent. If you lie on your back, put a pillow under your knees. Keep your head and neck in a straight line with your spine (neutral position) when using electronic equipment like  smartphones or pads. To do this: Raise your smartphone or pad to look at it instead of bending your head or neck to look down. Put the smartphone or pad at the level of your face while looking at the screen. Follow your treatment plan as told by your health care provider. This may include: Cognitive or behavioral therapy. Acupuncture or massage therapy. Meditation or yoga. Contact a health care provider if: You have pain that is not relieved with rest or medicine. You have increasing pain going down into your legs or buttocks. Your pain does not improve after 2 weeks. You have pain at night. You lose weight without trying. You have a fever or chills. You develop nausea or vomiting. You develop abdominal pain. Get help right away if: You develop new bowel or bladder control problems. You have unusual weakness or numbness in your arms or legs. You feel faint. These symptoms may represent a serious problem that is an emergency. Do not wait to see if the symptoms will go away. Get medical help right away. Call your local emergency services (911 in the [place] Do not drive yourself to the hospital. Summary Acute back pain is sudden and usually short-lived. Use proper lifting techniques. When you bend and lift, use positions that put less stress on your back. Take over-the-counter and prescription medicines only as told by your health care provider, and apply heat or ice as told. This information is not intended to replace advice given to you by your health care provider. Make sure you discuss any questions you have with your health care provider. Document Revised: 06/02/2020 Document Reviewed: 06/02/2020 Elsevier Patient Education  2024 [employer].
